# Patient Record
Sex: Male | Born: 1965
Health system: Southern US, Community
[De-identification: ages and names within clinical notes are randomized; demographics above are authoritative.]

---

## 2010-03-05 IMAGING — CR DG LUMBAR SPINE COMPLETE 4+V
5 series · 5 of 5 positions shown · non-contrast
Comparison: None

CLINICAL DATA: Low back pain

LUMBAR SPINE - COMPLETE 4+ VIEW

[view not recorded (1 of 5)]
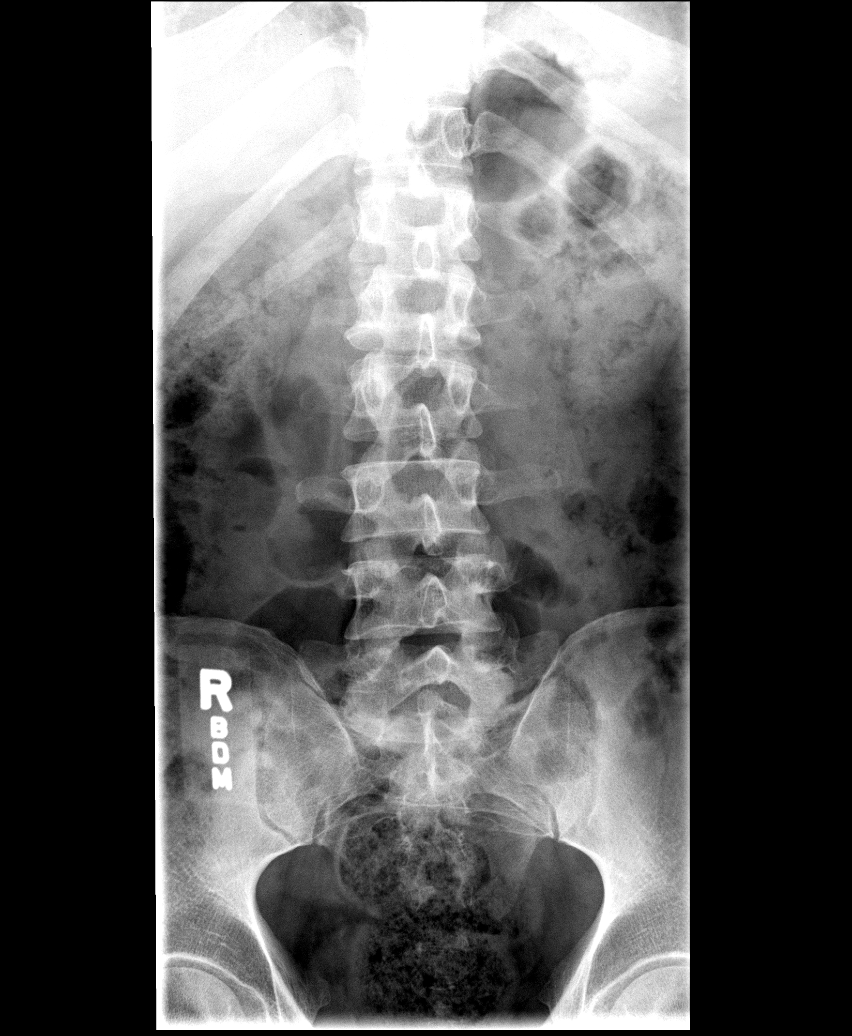

[view not recorded (2 of 5)]
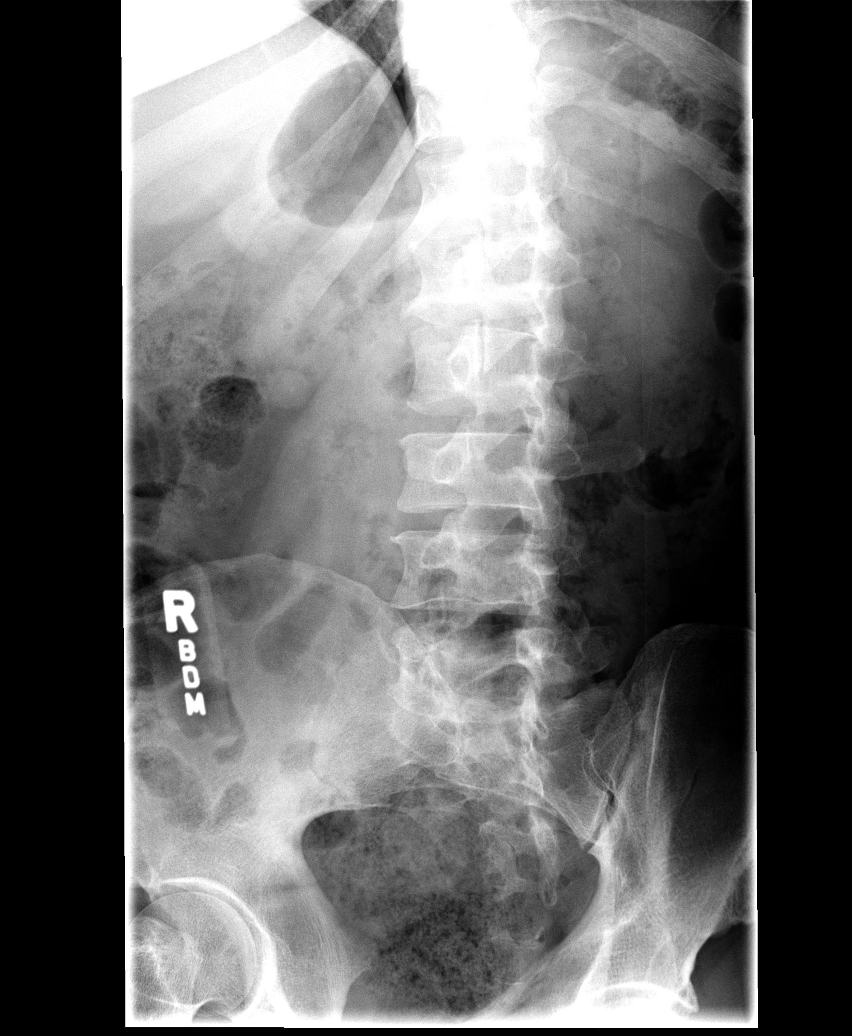

[view not recorded (3 of 5)]
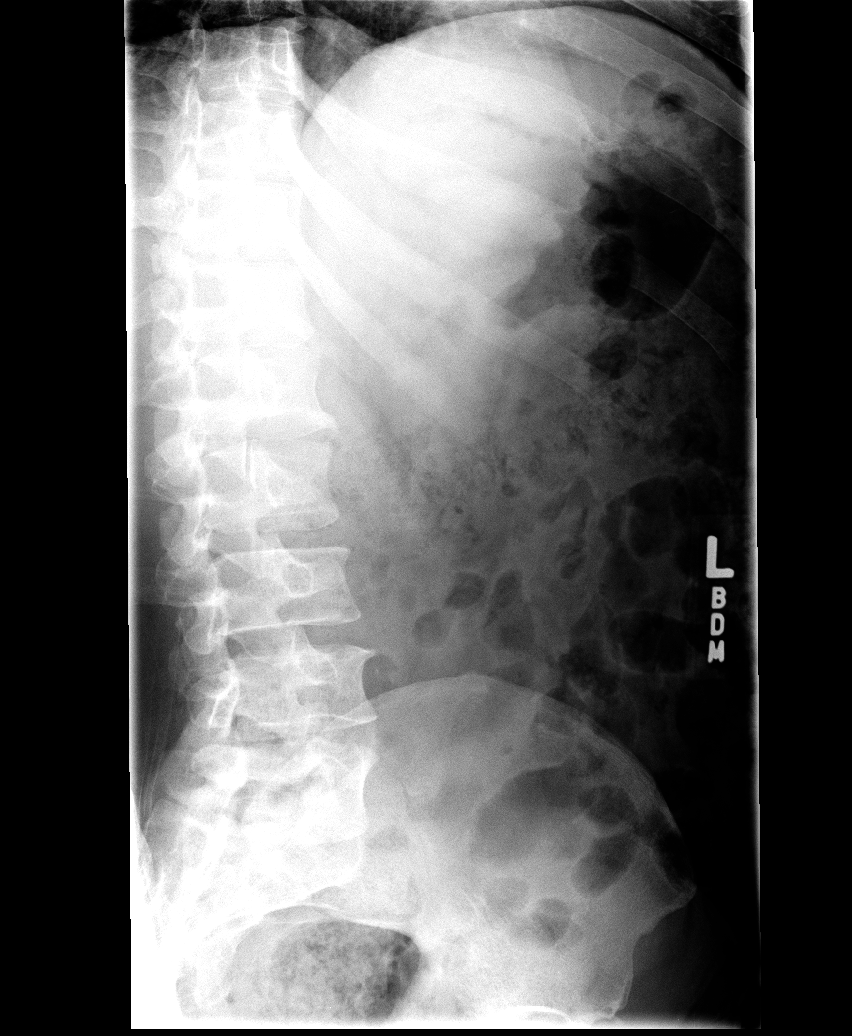

[view not recorded (4 of 5)]
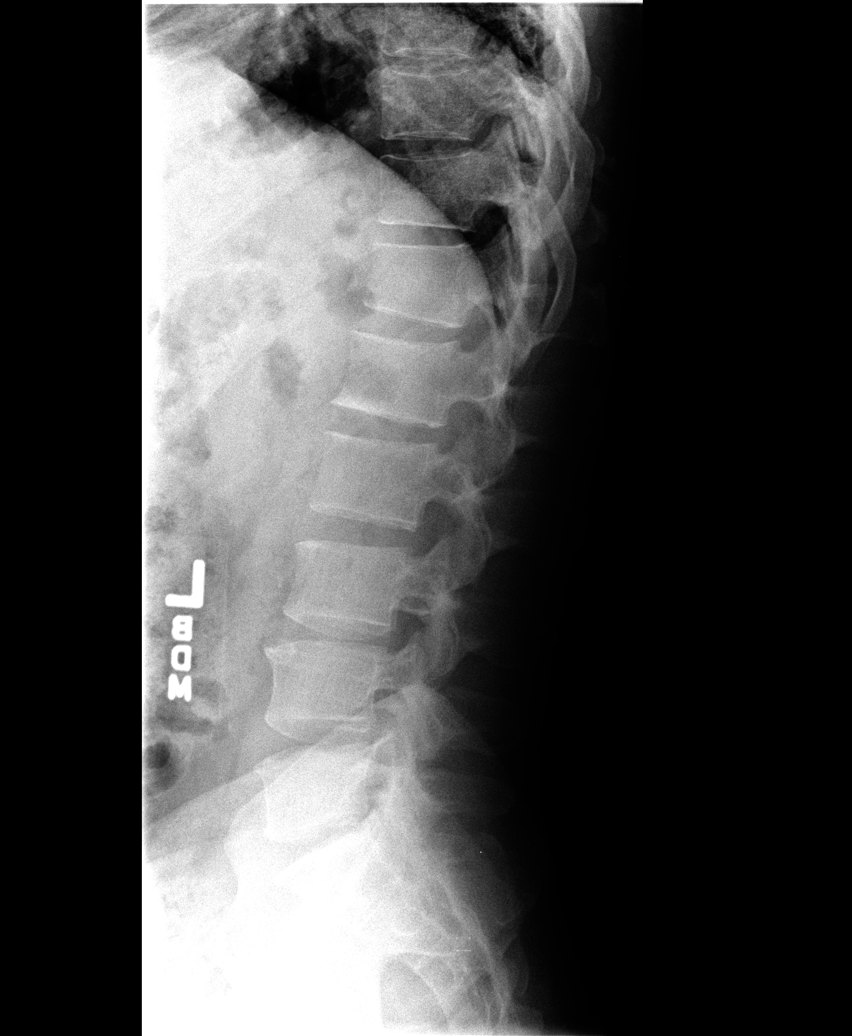

[view not recorded (5 of 5)]
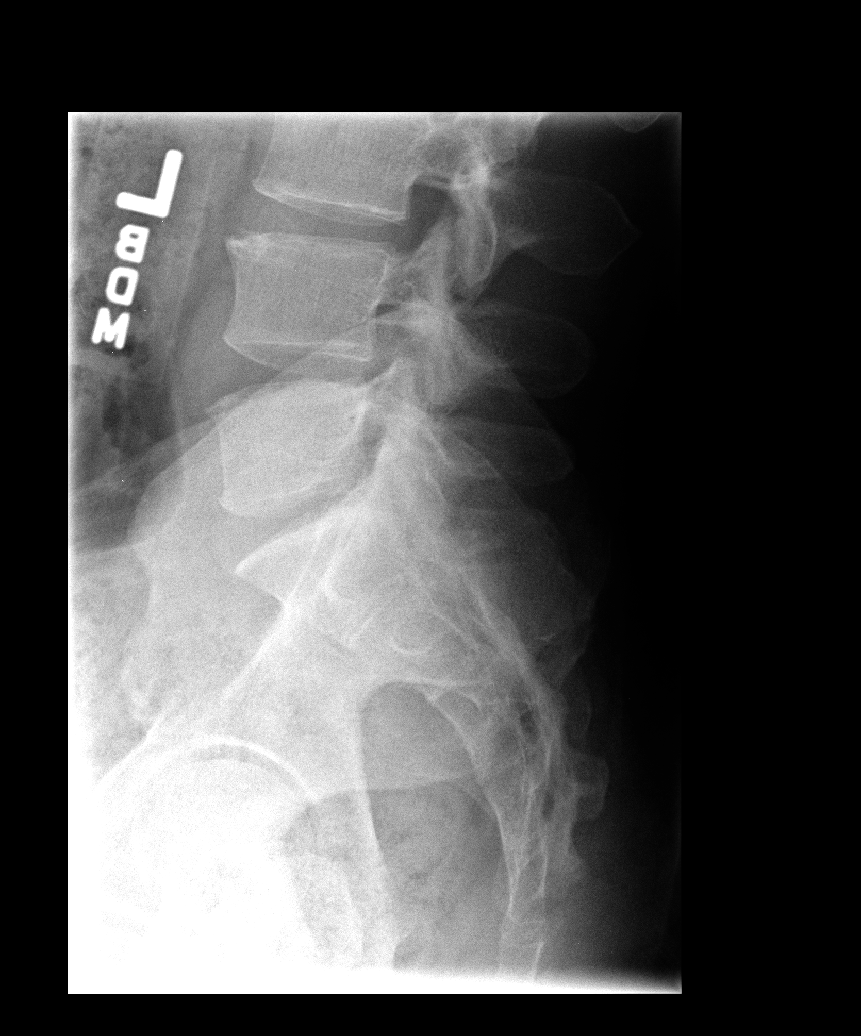

[5 of 5 positions shown; findings below may reference images not displayed]

FINDINGS: Negative for fracture.  Normal alignment.  Mild lumbar
disc degeneration without significant spondylosis.  Negative for
pars defect.
IMPRESSION: No acute bony abnormality.  Mild degenerative change.

## 2010-04-15 IMAGING — CR DG FOREARM 2V*L*
2 series · 2 of 2 positions shown · non-contrast
Comparison: None.

CLINICAL DATA: Fall.  Left forearm pain.

LEFT FOREARM - 2 VIEW

[view not recorded (1 of 2)]
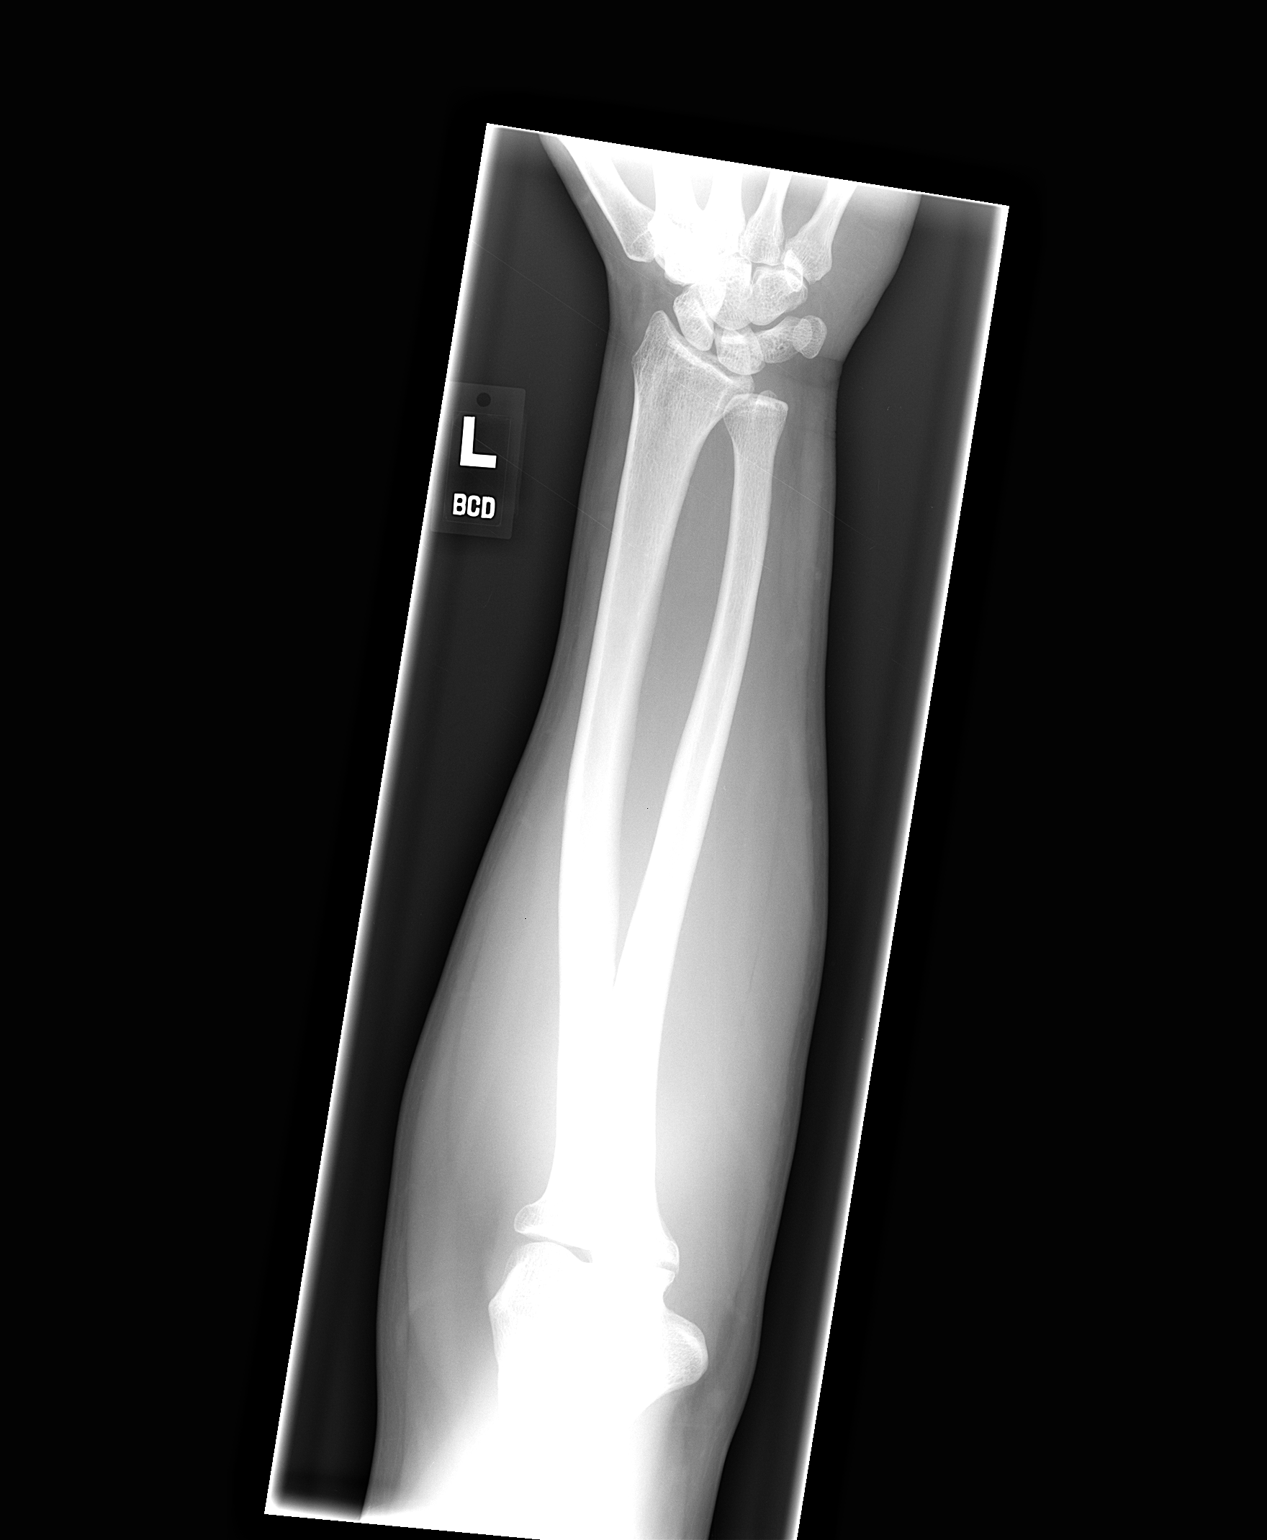

[view not recorded (2 of 2)]
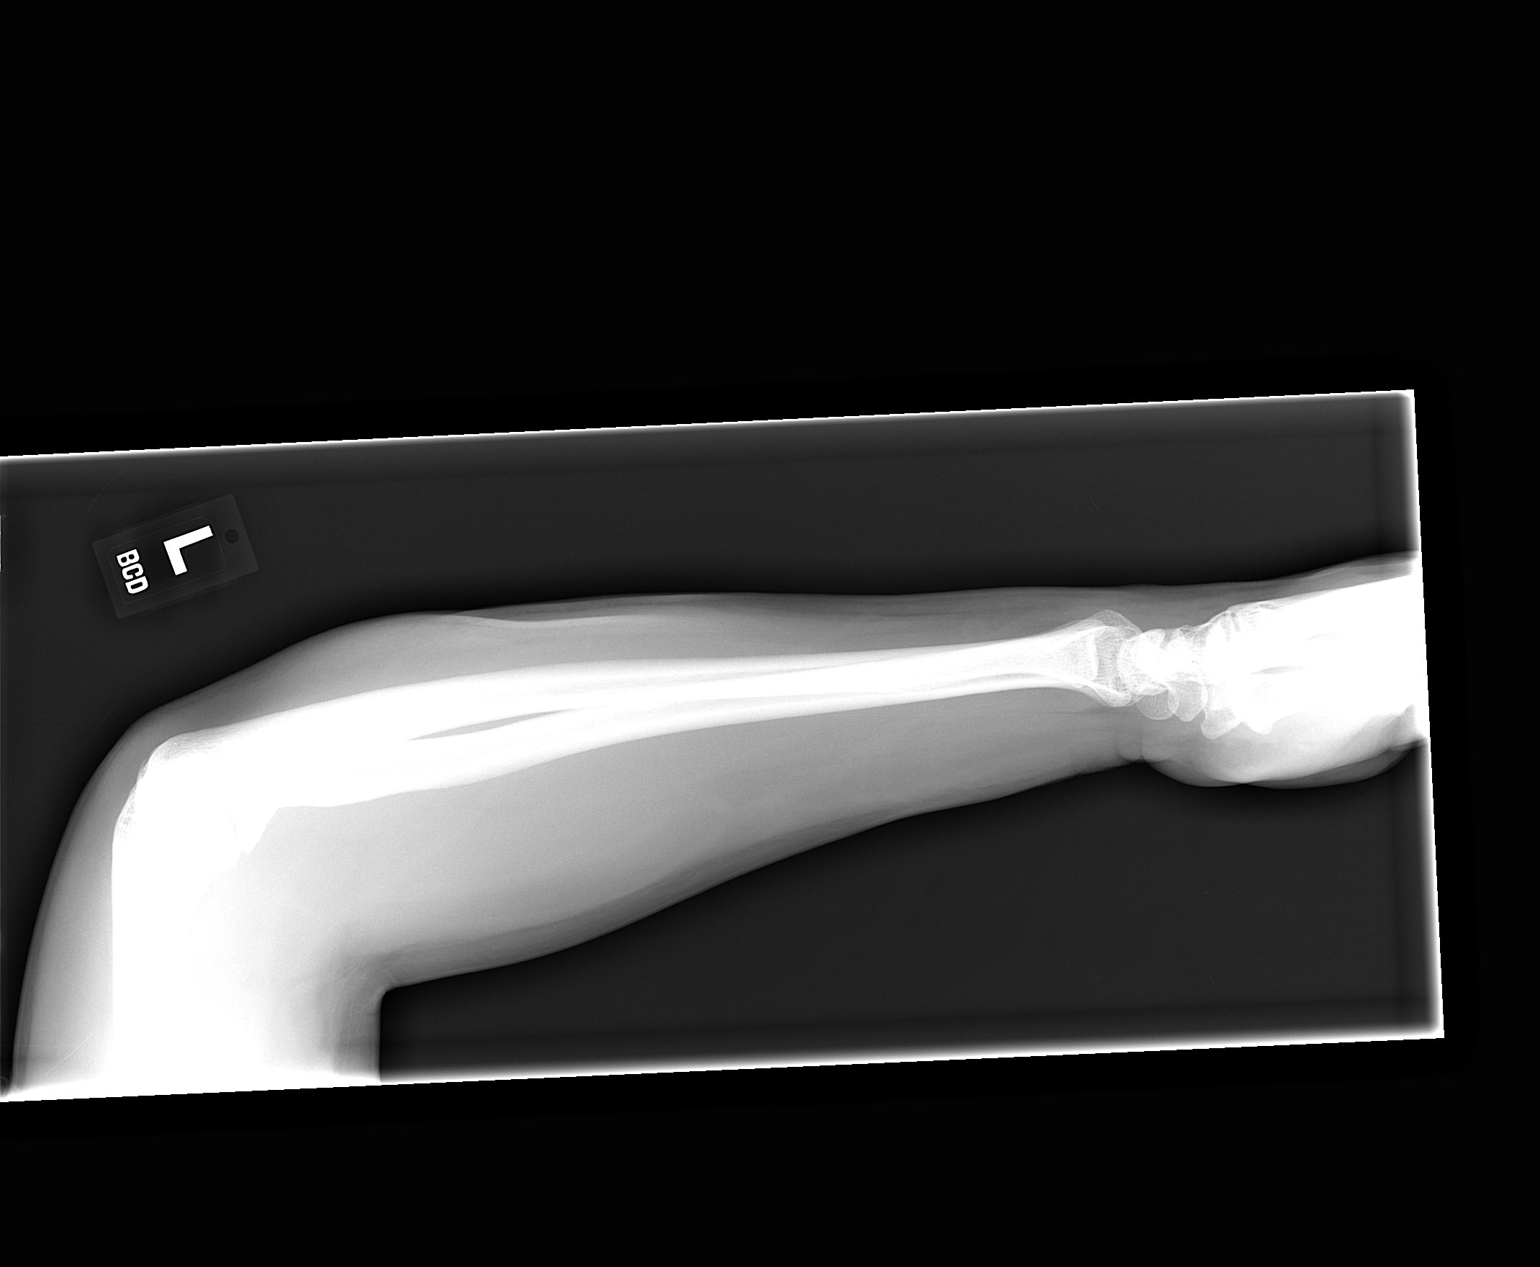

[2 of 2 positions shown; findings below may reference images not displayed]

FINDINGS: No cortical discontinuity is identified.  Slight
curvature of the radius is probably incidental, less likely to
represent a plastic bowing fracture.  If there is a high clinical
index of suspicion, contralateral forearm views could be obtained
to compare the degree of curvature in the radius, if this would
affect therapy.
IMPRESSION: 1.  No cortical discontinuity identified to suggest overt fracture.
Slight curvature of the radius is probably within normal limits but
the contralateral radius views could be obtained to help rule out
plastic bowing fracture if clinically warranted.

## 2013-03-04 IMAGING — CR DG LUMBAR SPINE COMPLETE 4+V
5 series · 5 of 5 positions shown · non-contrast
Comparison: [DATE]

CLINICAL DATA: Back pain for 2 months.  No known injury.

EXAM:
LUMBAR SPINE - COMPLETE 4+ VIEW

[t l-spine a.p.]
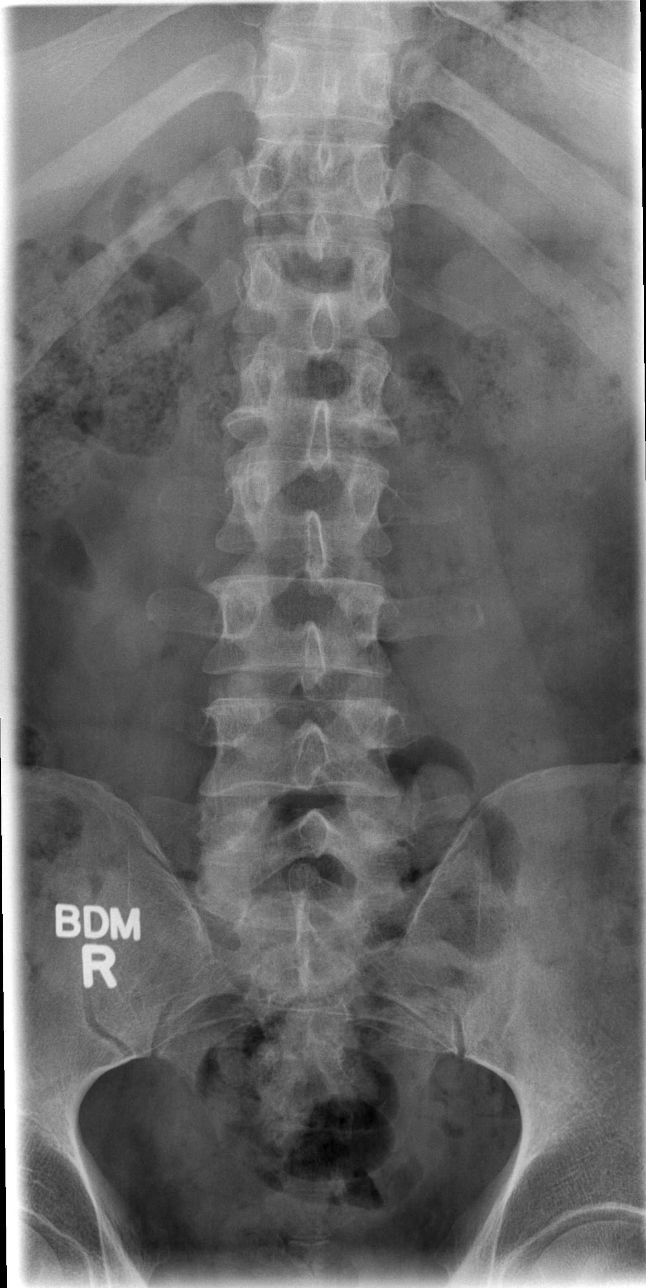

[t l-spine oblique exposure (1 of 2)]
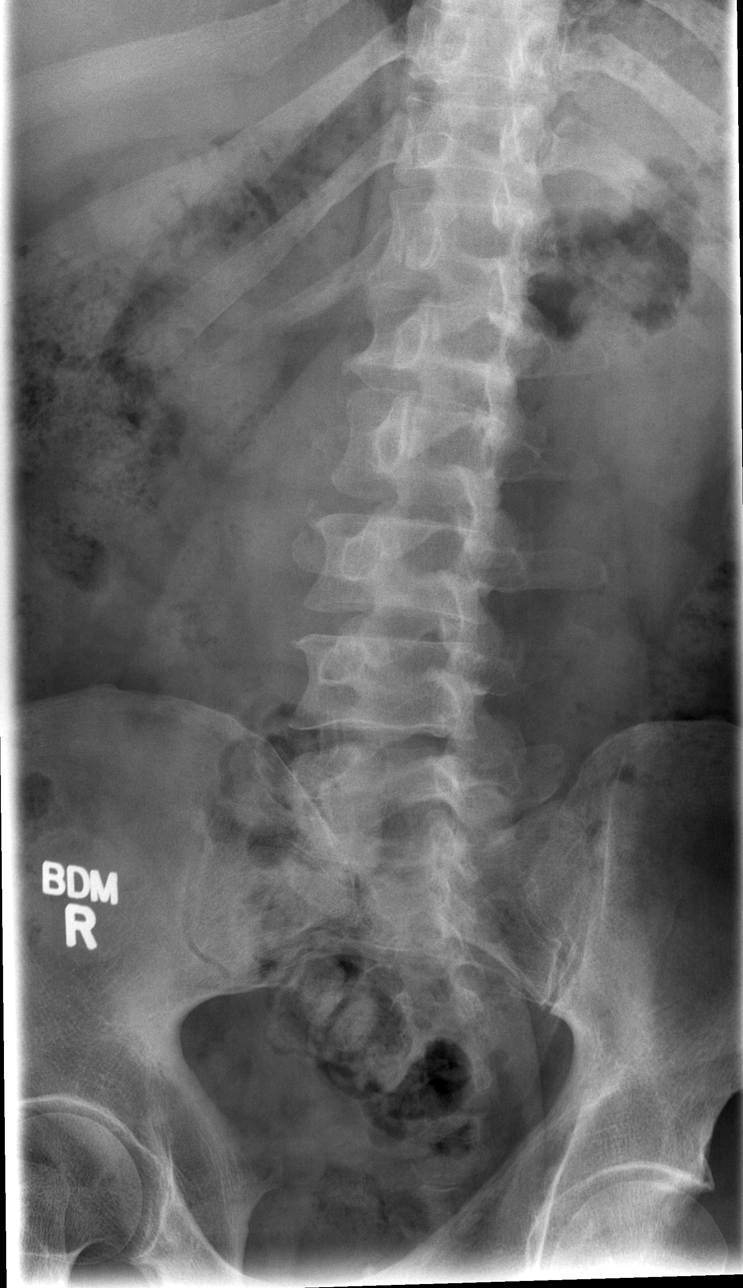

[t l-spine oblique exposure (2 of 2)]
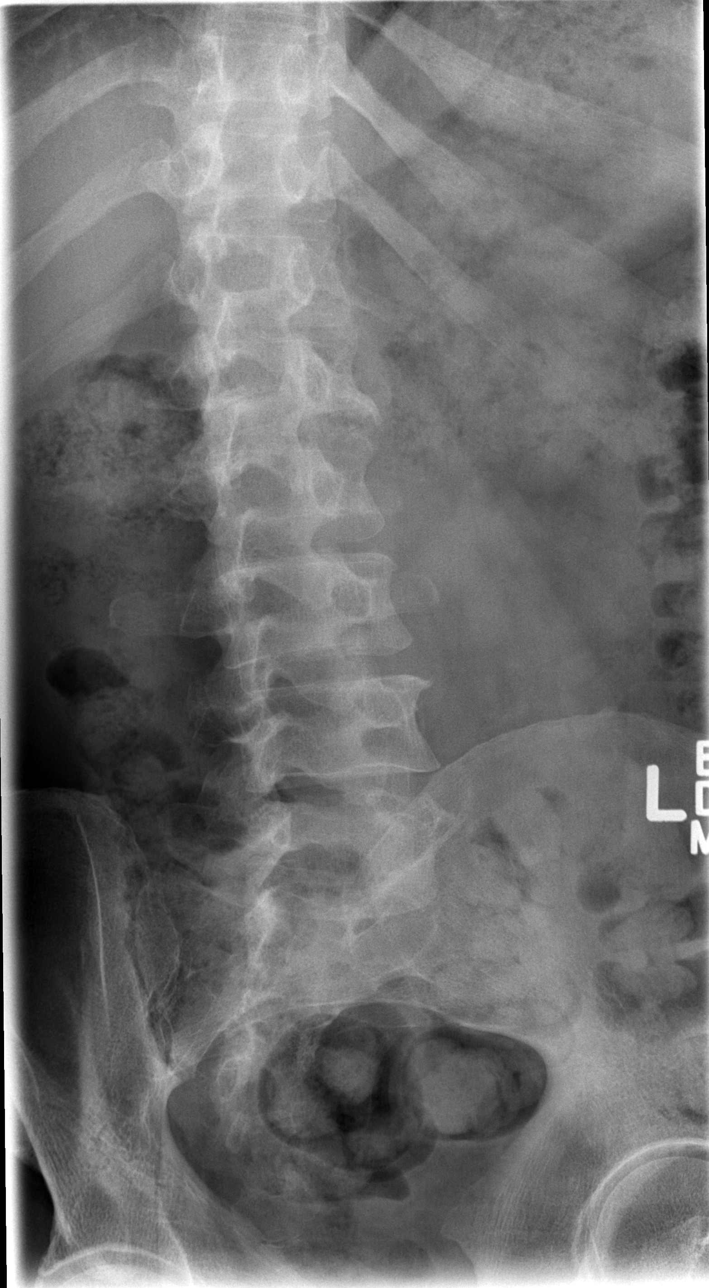

[t l-spine lat]
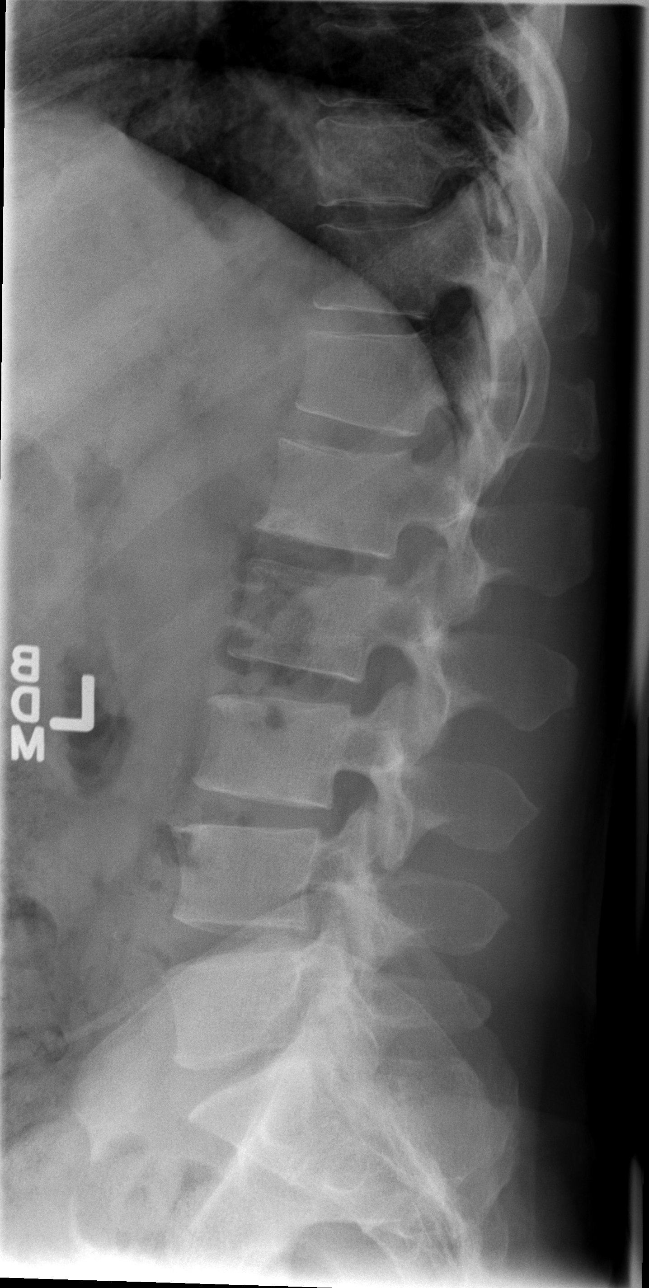

[t l-spine l5-s1 spot]
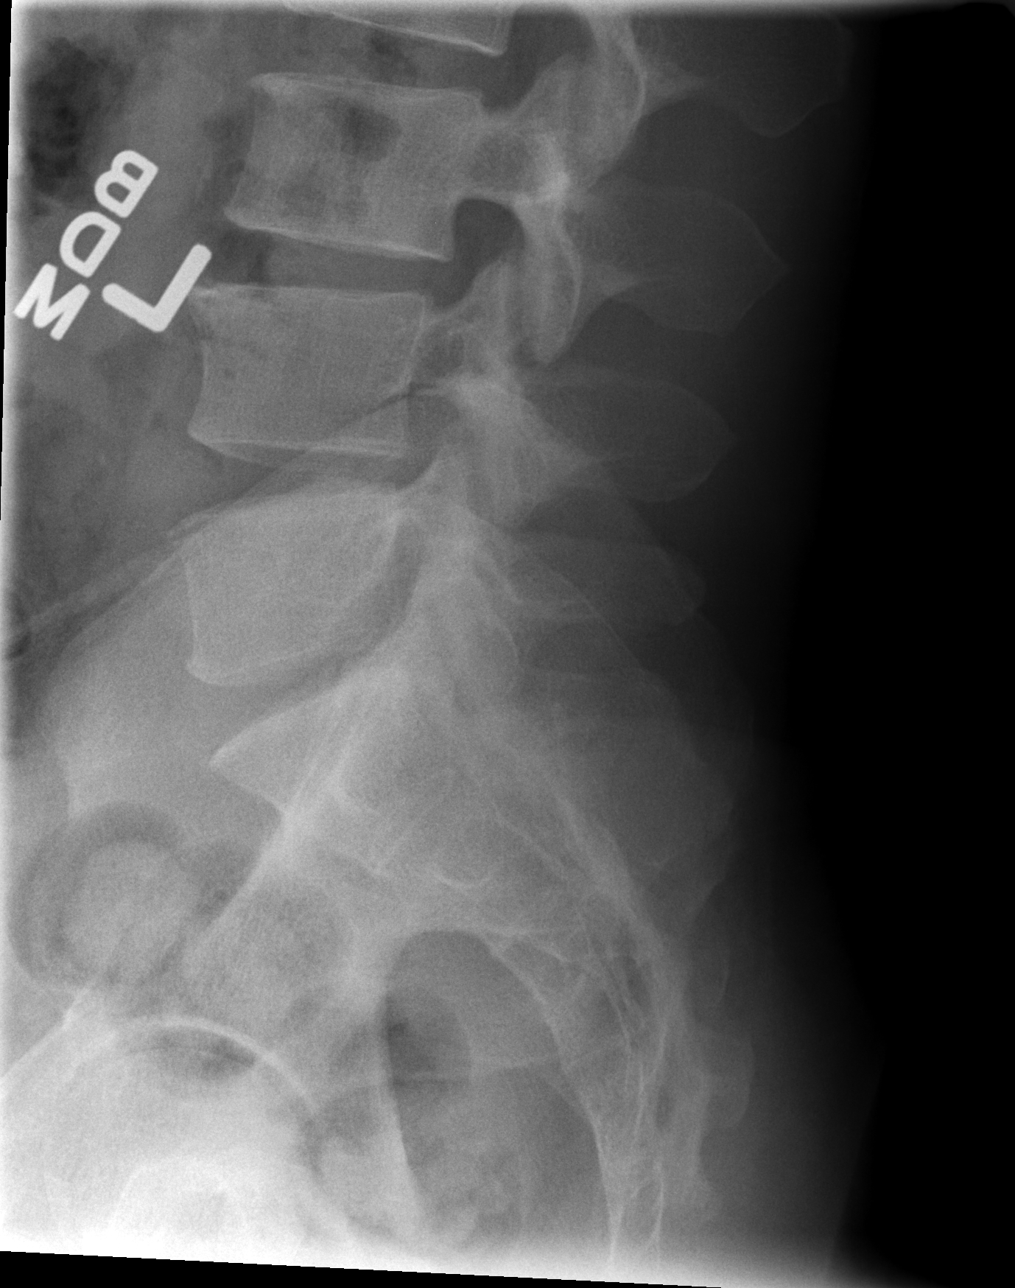

[5 of 5 positions shown; findings below may reference images not displayed]

FINDINGS: No fracture. No spondylolisthesis. Minimal curvature of the lumbar
spine, convex to the right. Minimal endplate osteophytes are noted
at several levels. No other degenerative change. Normal soft
tissues.
IMPRESSION: No fracture.  No acute finding. No change from the prior study.

## 2016-09-20 IMAGING — DX DG FOOT COMPLETE 3+V*L*
3 series · 3 of 3 positions shown · non-contrast
Comparison: None.

CLINICAL DATA: 50-year-old male with left foot injury and pain.

EXAM:
LEFT FOOT - COMPLETE 3+ VIEW

[foot ap]
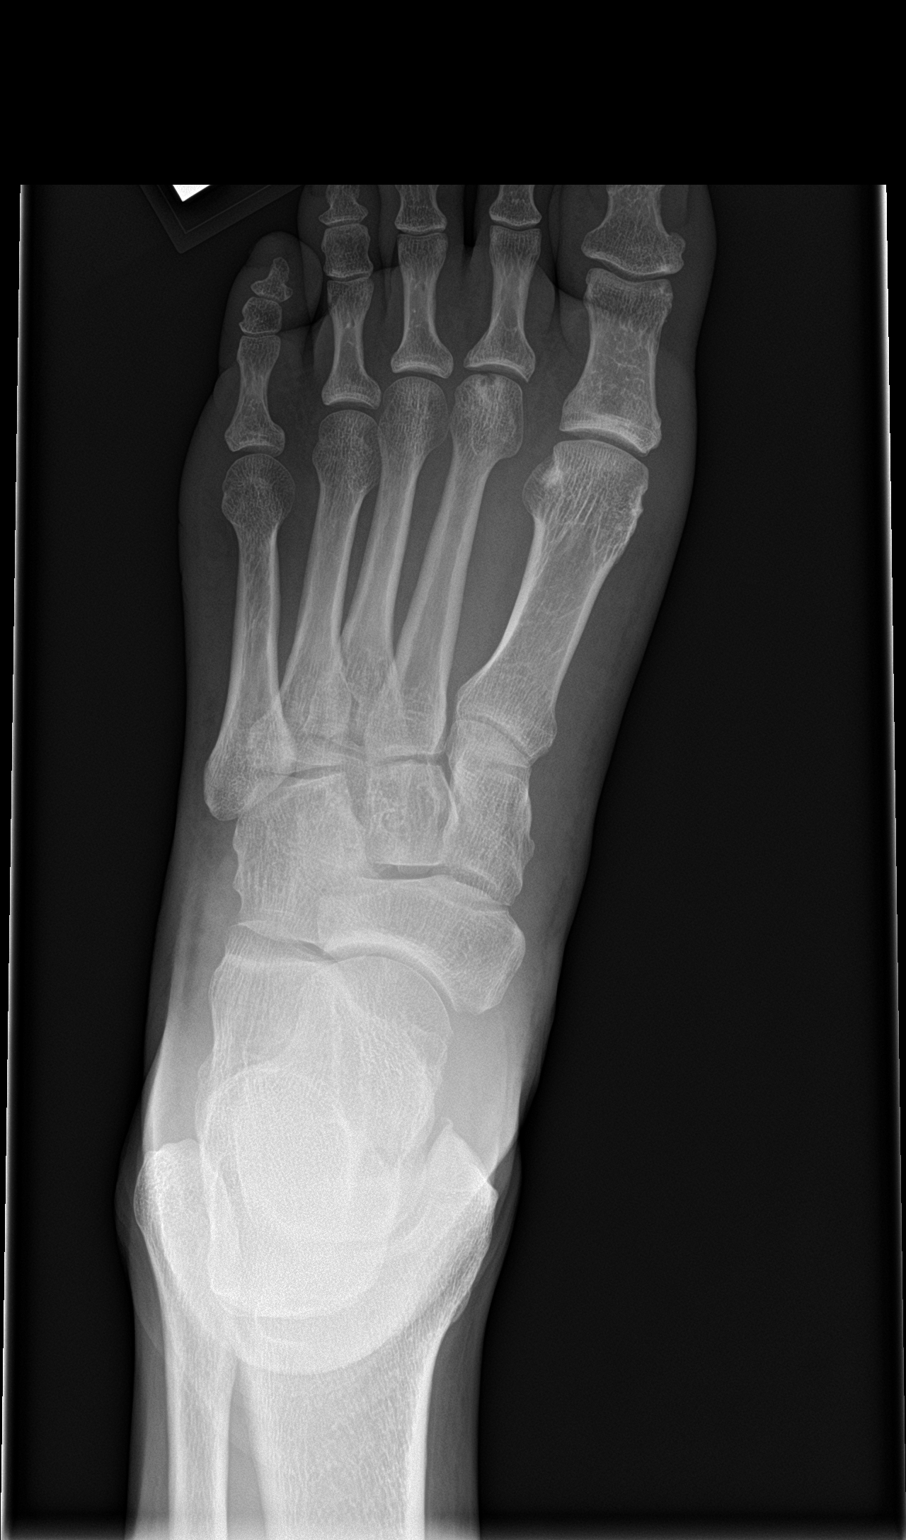

[foot obl]
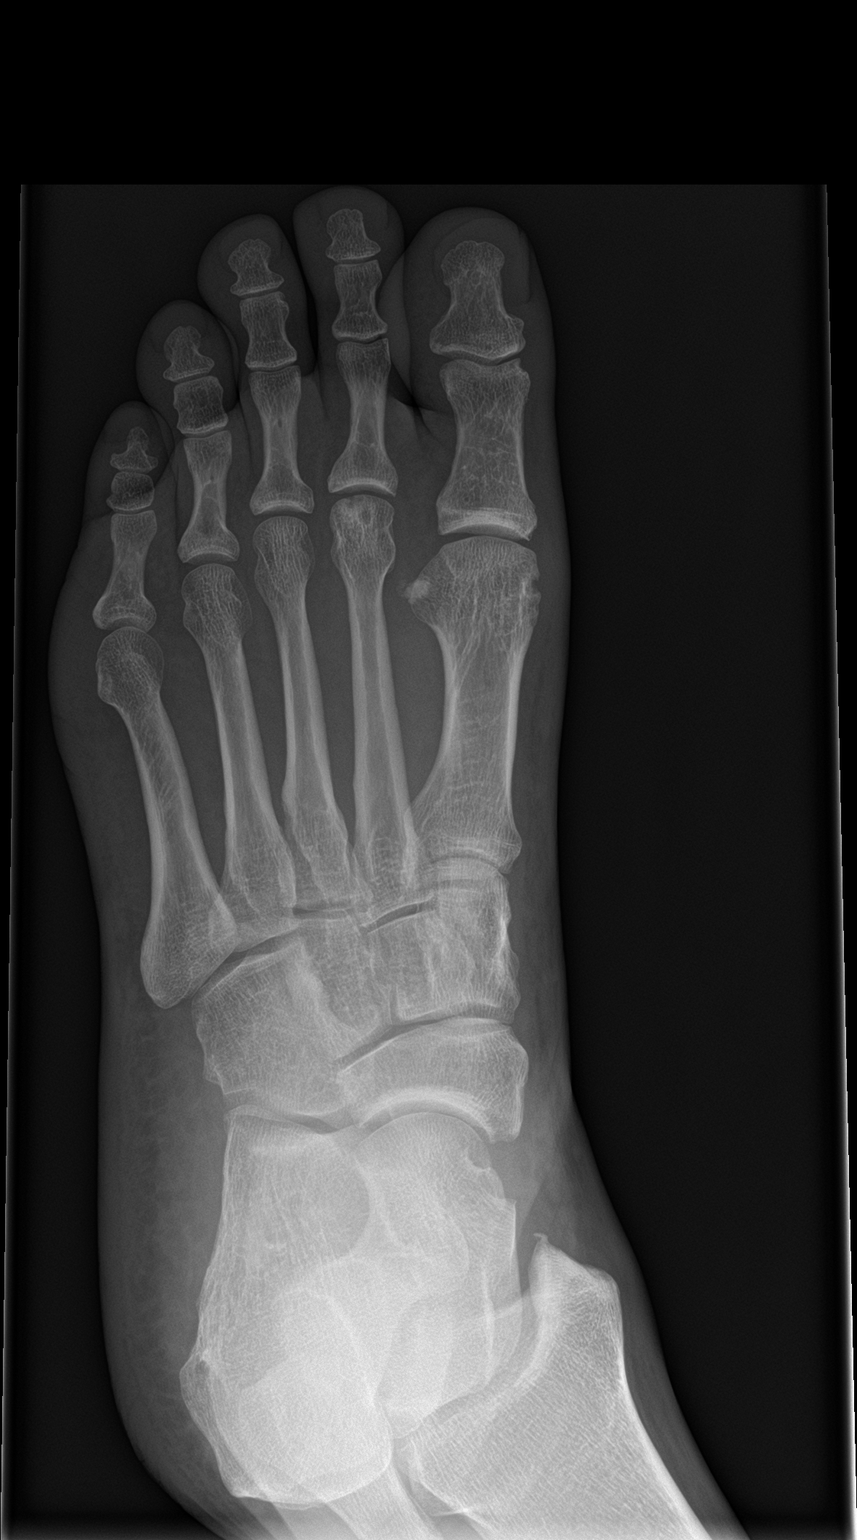

[foot lat]
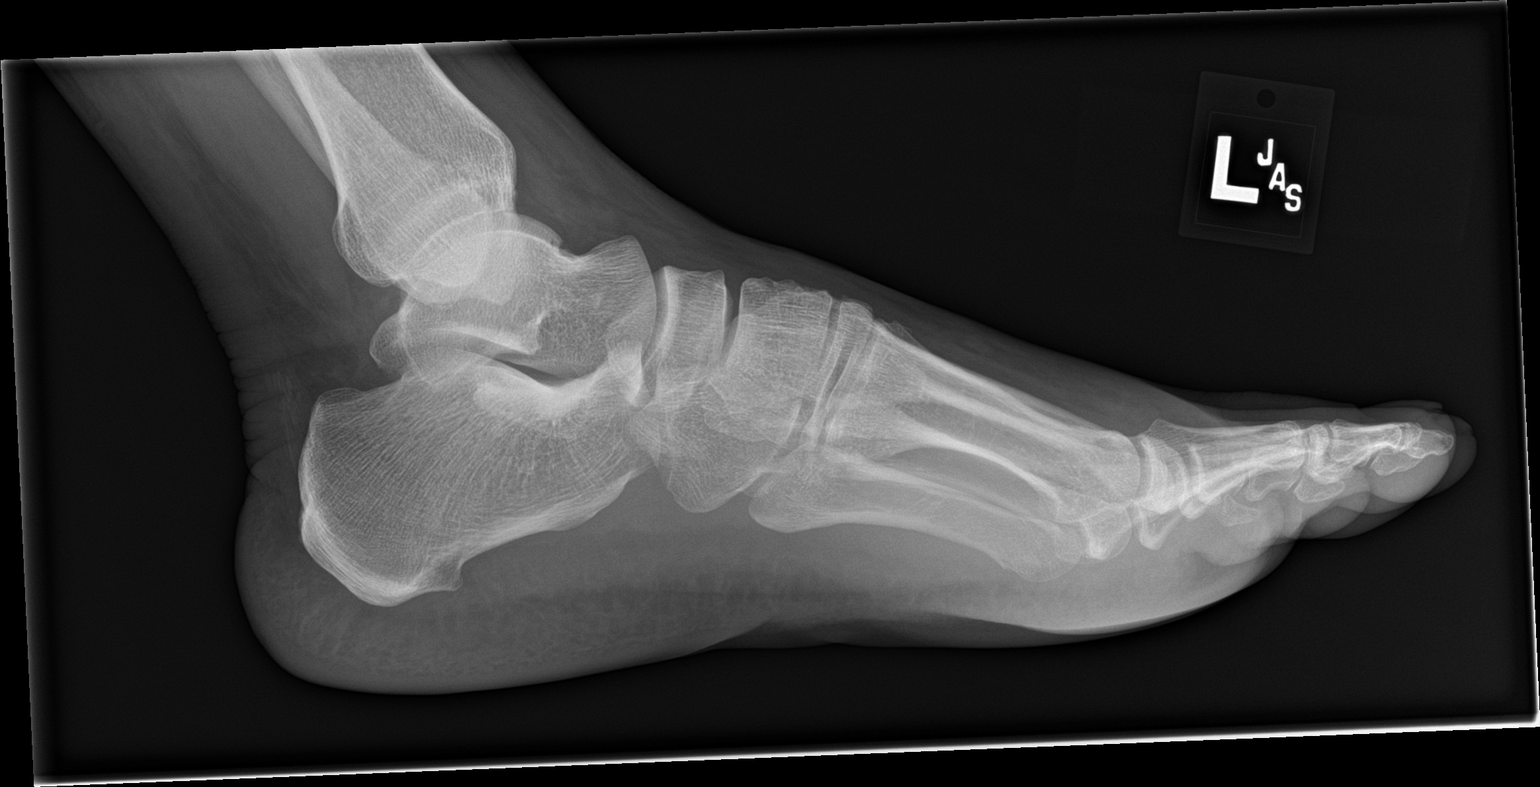

[3 of 3 positions shown; findings below may reference images not displayed]

FINDINGS: There is no acute fracture or dislocation. There are mild erosive
changes of the medial aspect of the first metatarsal head with
overhanging edge as well as erosive changes of the second metatarsal
head. Correlation with history of gout recommended. The bones are
well mineralized. The soft tissues appear unremarkable.
IMPRESSION: 1. No acute fracture or dislocation.
2. Erosive changes of the first and second metatarsal heads
suggestive of gouty arthritis. Correlation with clinical exam and
history recommended.

## 2017-03-21 IMAGING — CR DG CHEST 2V
1 series · 2 of 2 positions shown · non-contrast
Comparison: None.

CLINICAL DATA: Left-sided chest pain for 8 days.

EXAM:
CHEST  2 VIEW

[Series 1: chest pa · 0.14mm/px · 2 of 2 slices shown]
[im 1/2]
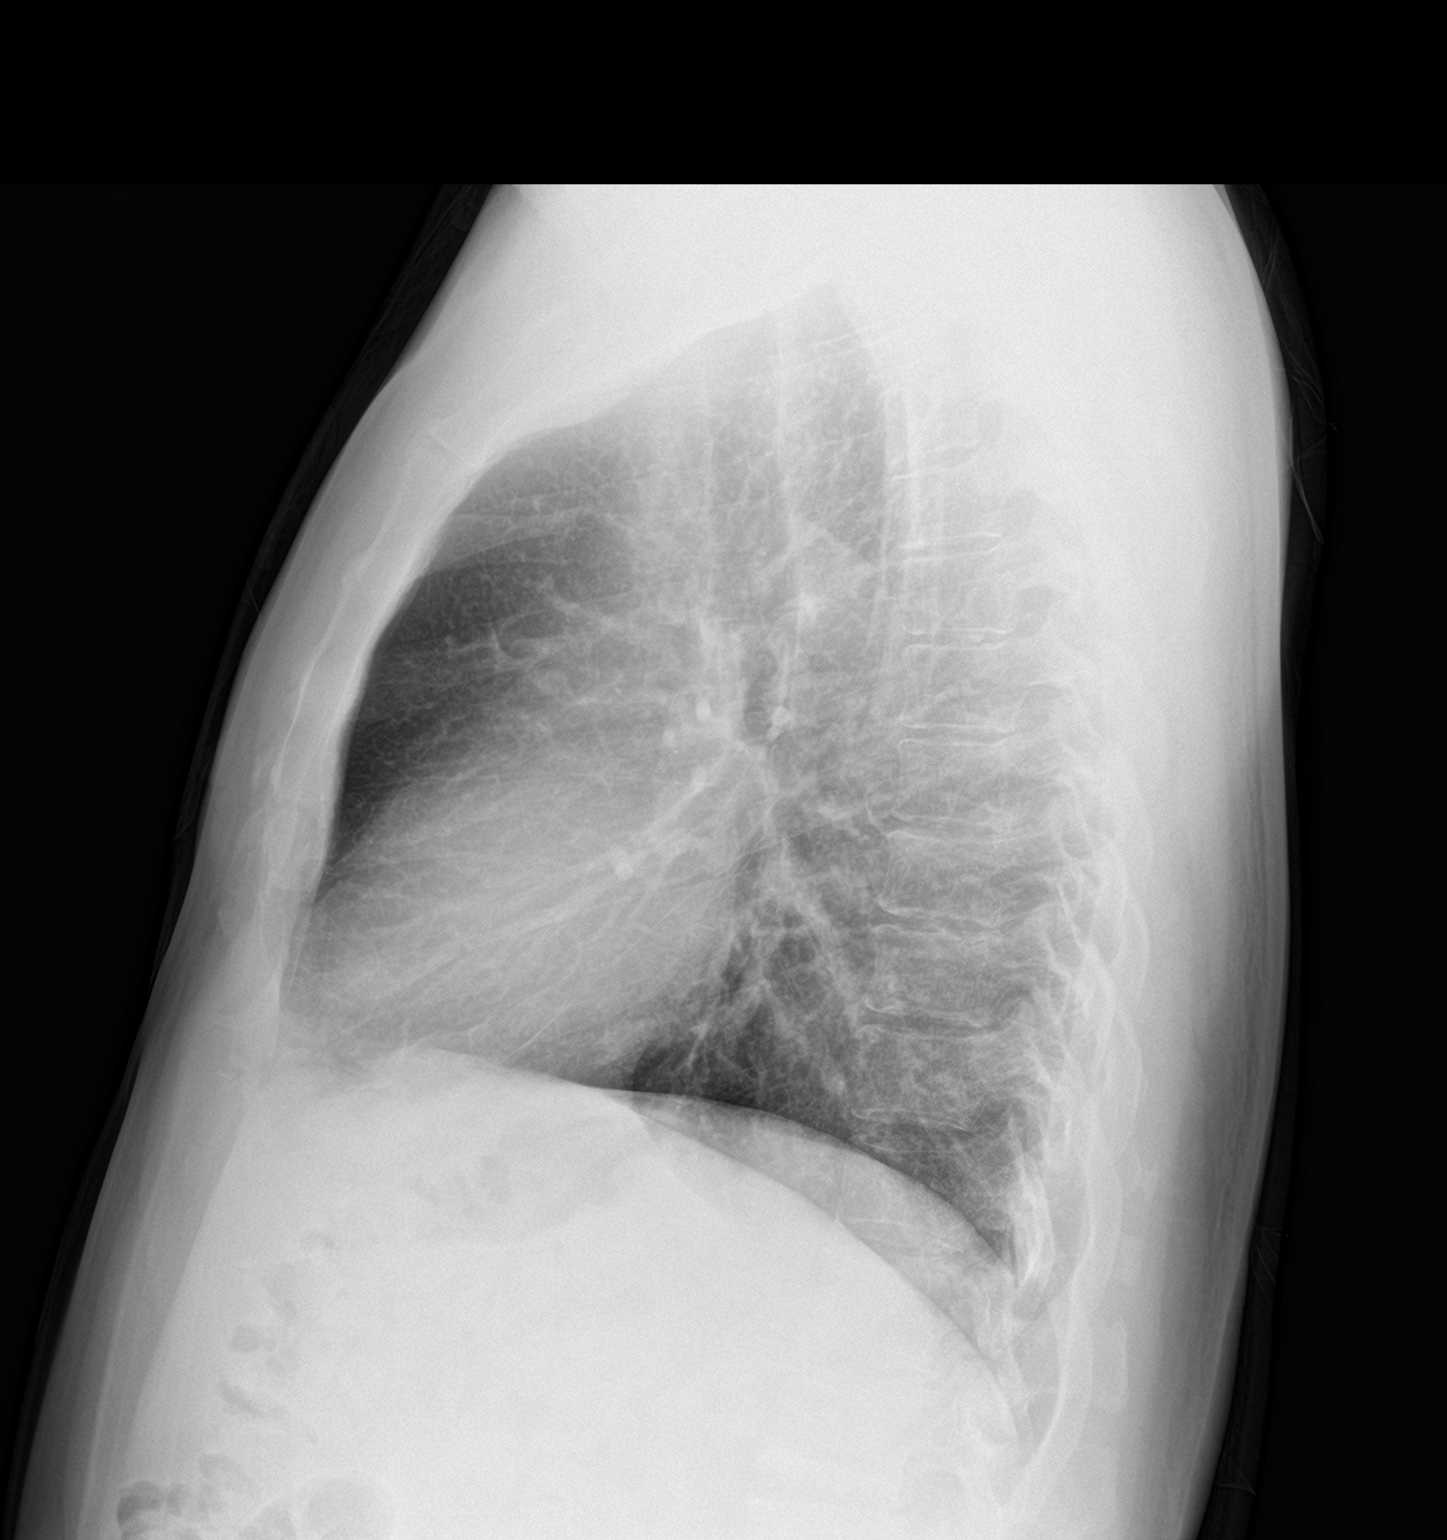
[im 2/2]
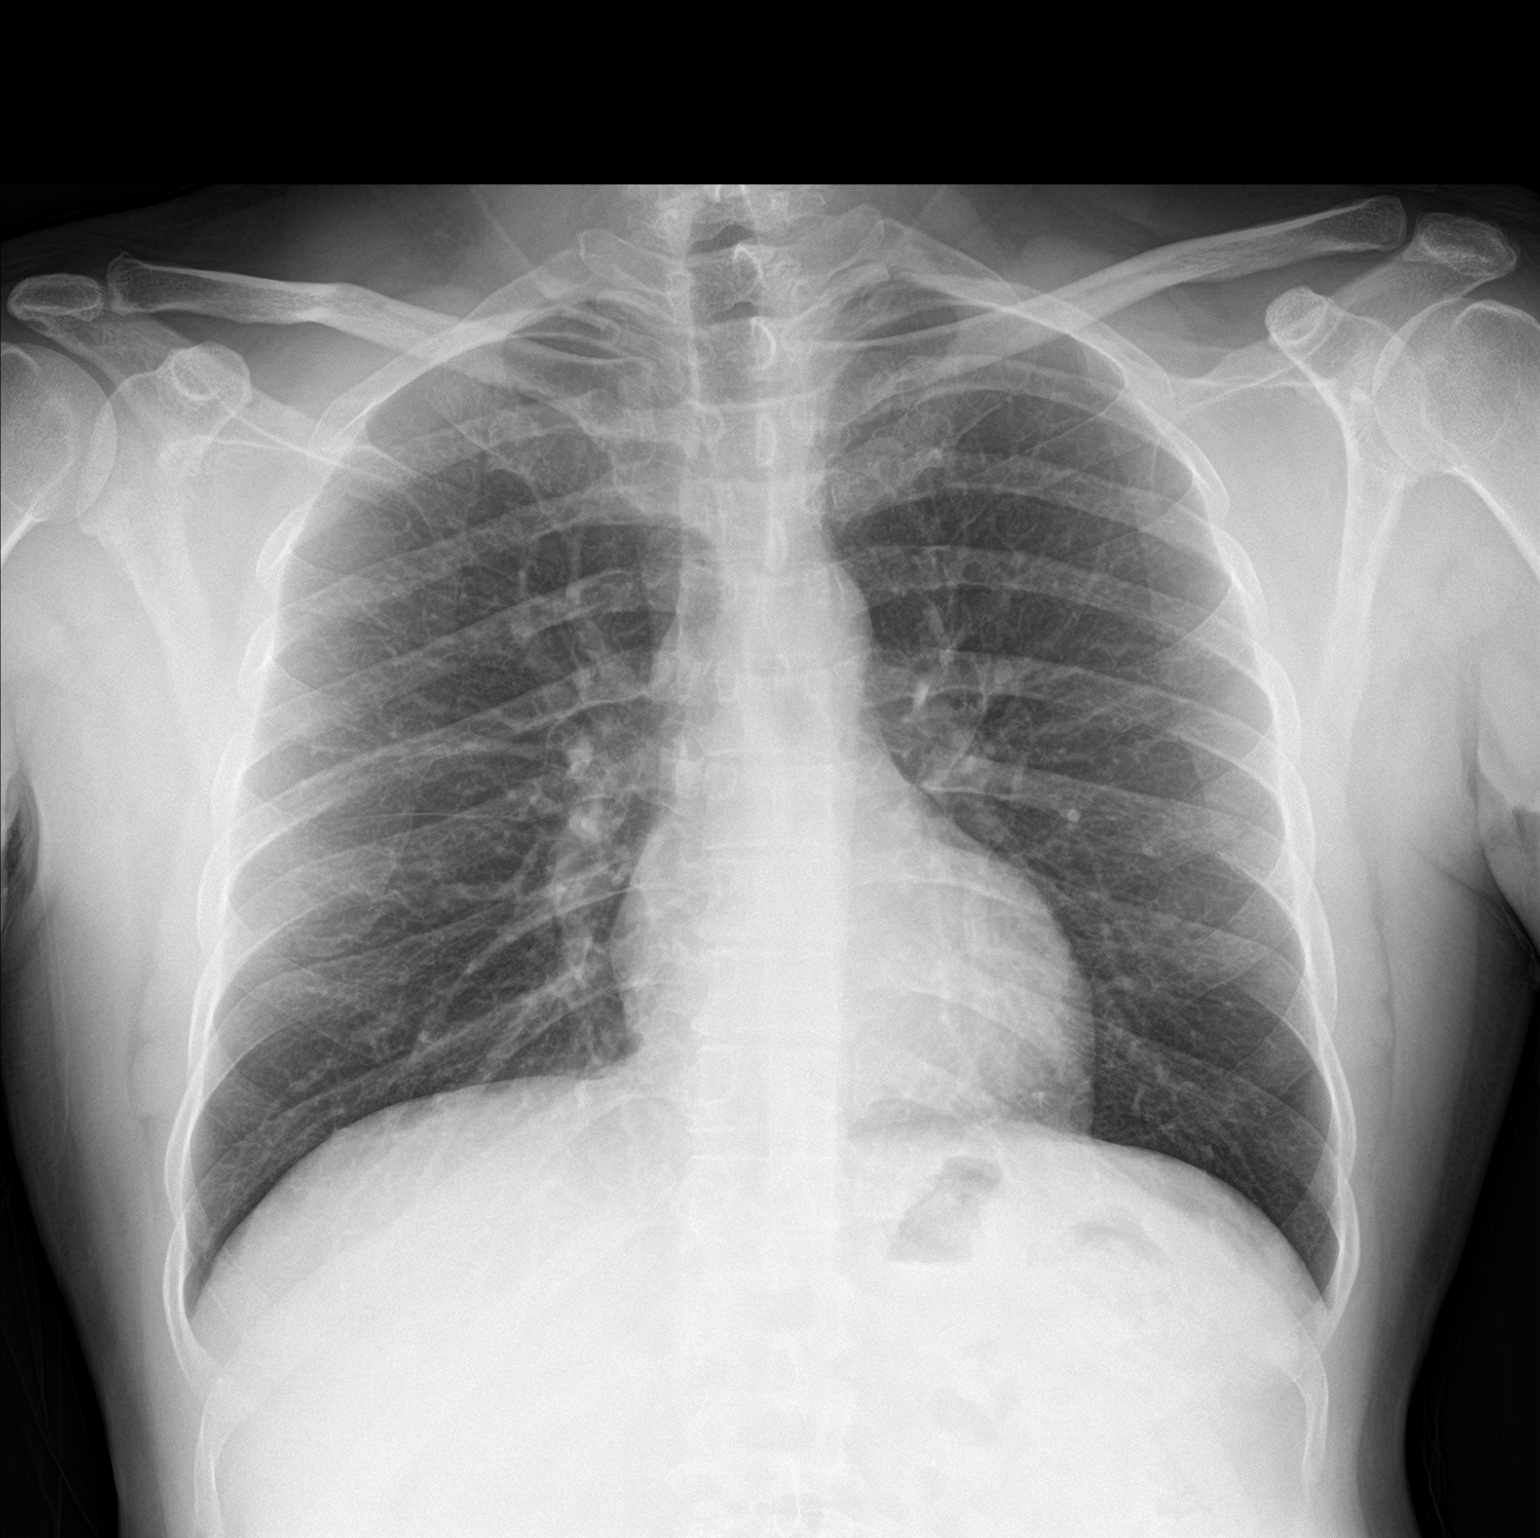

[2 of 2 positions shown; findings below may reference images not displayed]

FINDINGS: The heart size and mediastinal contours are within normal limits.
Both lungs are clear. The visualized skeletal structures are
unremarkable.
IMPRESSION: No active cardiopulmonary disease.

## 2019-05-20 MED FILL — metFORMIN HCL 500 MG TABS: 500 | 30 days supply | Qty: 60 | Fill #0

## 2019-05-20 MED FILL — LOSARTAN-HCTZ 100-12.5 MG T: 100-12.5 | 30 days supply | Qty: 30 | Fill #0

## 2020-01-27 IMAGING — CT CT HEAD W/O CM
4 series · 16 of 47 positions shown, 18 images · non-contrast
Comparison: None.

CLINICAL DATA: Hyperglycemia, intermittent medication use, nausea,
vomiting and dizziness

EXAM:
CT HEAD WITHOUT CONTRAST
TECHNIQUE: Contiguous axial images were obtained from the base of the skull
through the vertex without intravenous contrast.

[Series 3: head wo · axial · 0.39mm/px · z∈[-175,-60]mm · 6 of 33 slices shown, 8 images]
[im 5/33  brain]
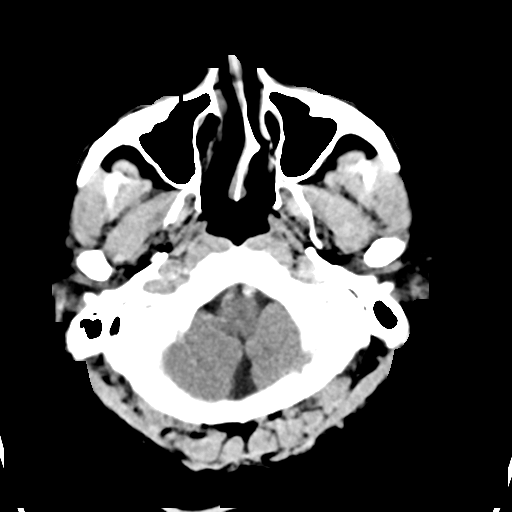
[im 5/33  bone]
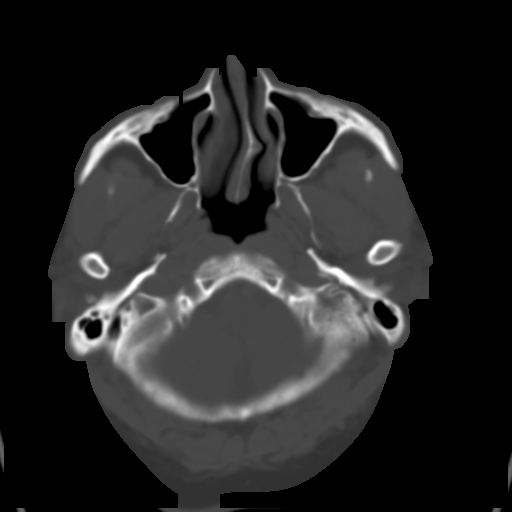
[im 10/33  brain]
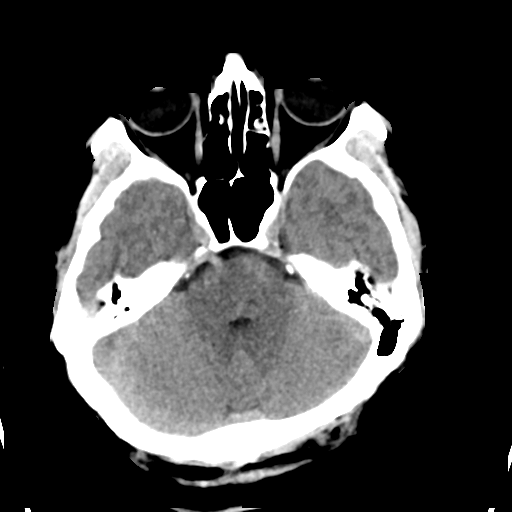
[im 14/33  brain]
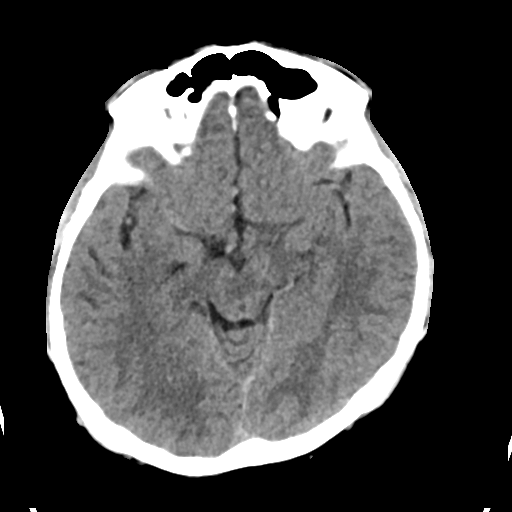
[im 19/33  brain]
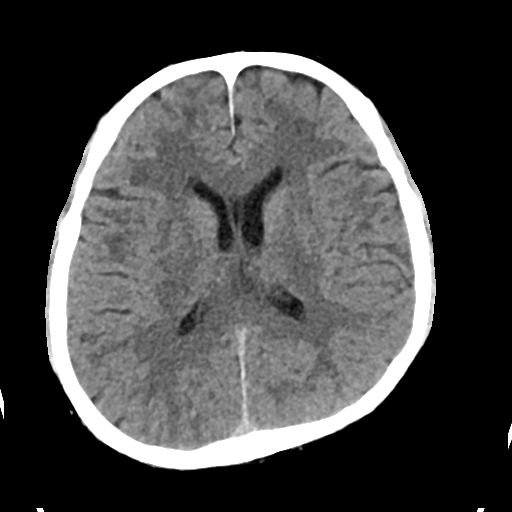
[im 23/33  brain]
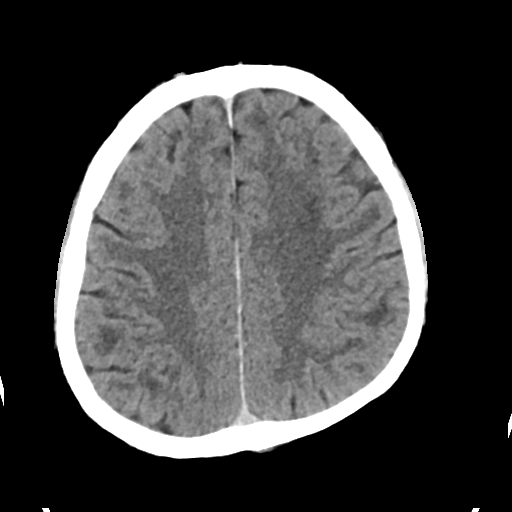
[im 23/33  bone]
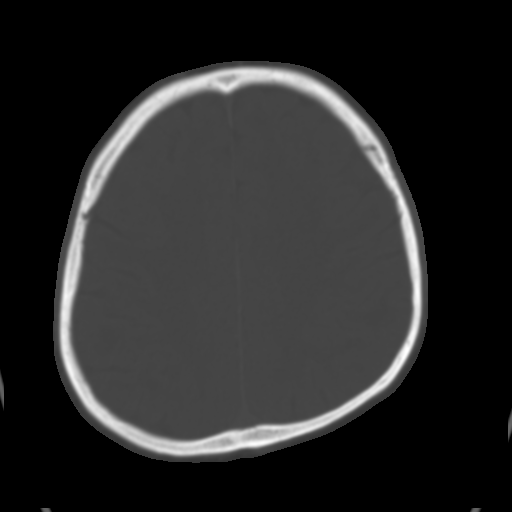
[im 28/33  brain]
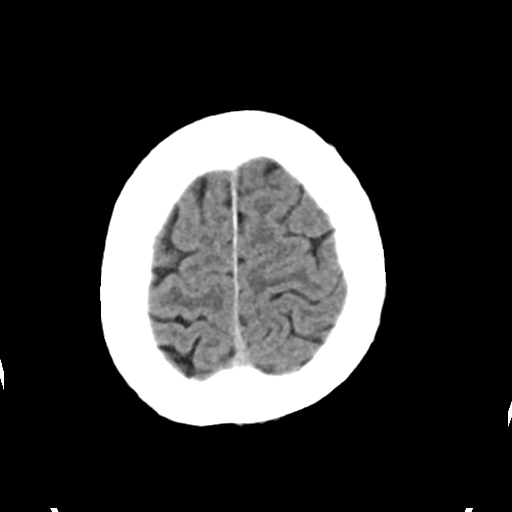

[Series 4: head bone · axial · 0.39mm/px · z∈[-181,-125]mm · 4 of 84 slices shown]
[im 8/84  bone]
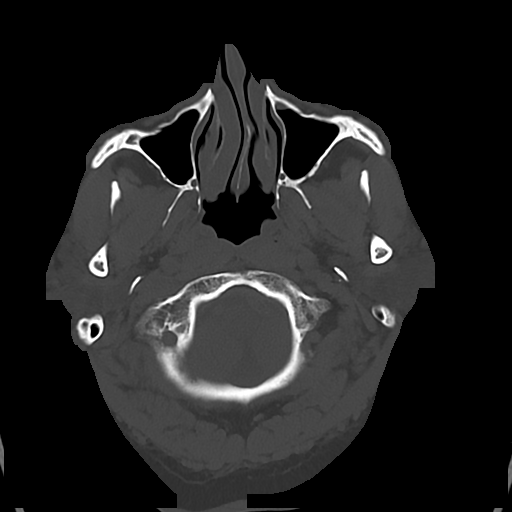
[im 16/84  bone]
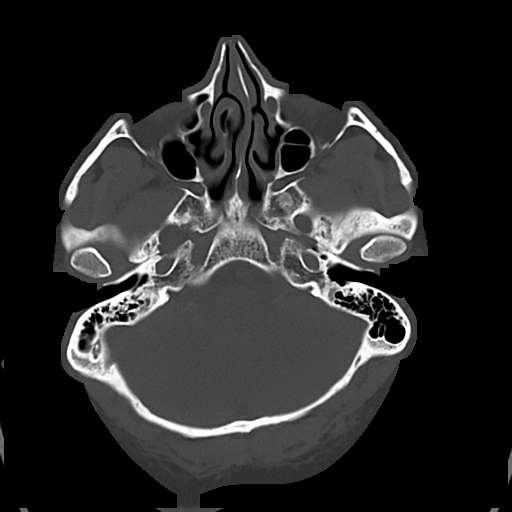
[im 28/84  bone]
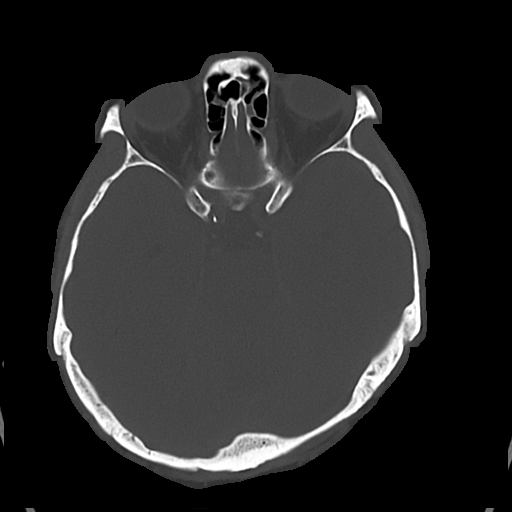
[im 36/84  bone]
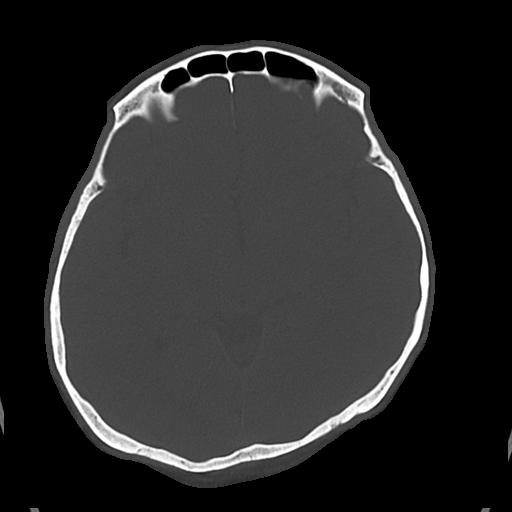

[Series 5: cor soft · coronal · 0.37mm/px · 3 of 68 slices shown]
[im 23/68  brain]
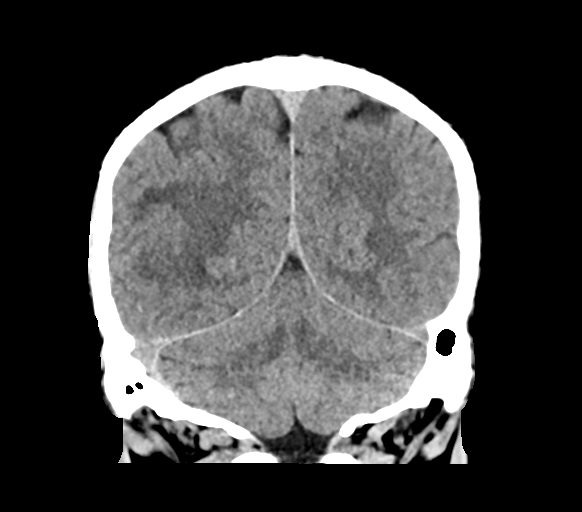
[im 30/68  brain]
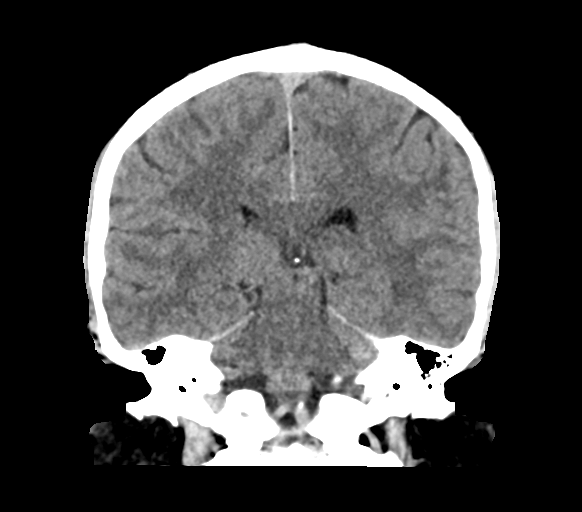
[im 38/68  brain]
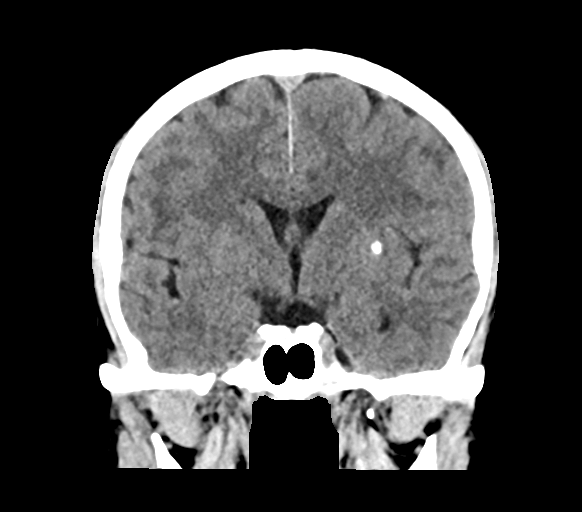

[Series 6: sag soft · sagittal · 0.43mm/px · 3 of 62 slices shown]
[im 21/62  brain]
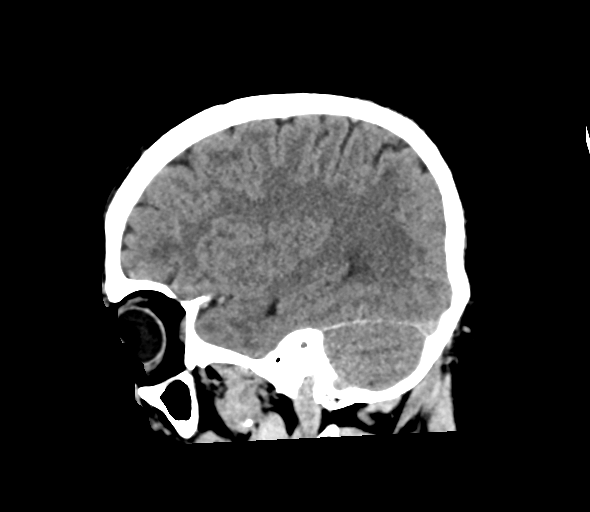
[im 31/62  brain]
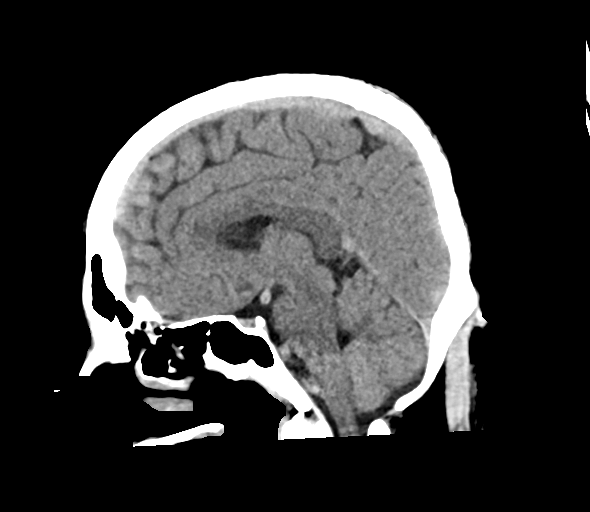
[im 41/62  brain]
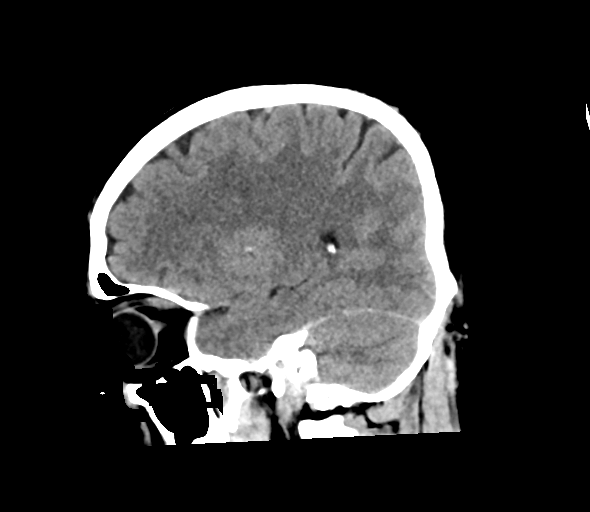

[16 of 47 positions shown; findings below may reference images not displayed]

FINDINGS: Brain: 7 mm calcification is seen with left lentiform nucleus,
nonspecific in appearance, possibly the result prior vascular
injury, insult versus prior infection. Additional quite focal
subcortical areas of white matter hypoattenuation throughout the
supratentorial brain are also quite nonspecific particularly in the
given patient history. No acute intracranial hemorrhage. No large
vascular territory infarct or cortically based hypoattenuation is
seen.

Vascular: Atherosclerotic calcification of the carotid siphons. No
hyperdense vessel.

Skull: Asymmetry of the skull, likely reflecting a unilateral
plagiocephaly, developmental variant. No scalp swelling or hematoma.
No calvarial fracture or acute osseous abnormality.

Sinuses/Orbits: Paranasal sinuses and mastoid air cells are
predominantly clear. Included orbital structures are unremarkable.

Other: None.
IMPRESSION: 1. No acute intracranial abnormality.
2. Multitude of focal subcortical white matter hypoattenuation
3. 7 mm calcification with left lentiform nucleus, nonspecific in
appearance, possibly the result prior vascular insult or prior
infection.
4. Additional numerous scattered areas of quite focal subcortical
white matter hypoattenuation are nonspecific, predominantly in a
juxtacortical distribution rather than periventricular. While this
could reflect some chronic microvascular change particularly in the
setting of diabetes, demyelination or other white matter processes
are not fully excluded.
5. Asymmetry of the skull, likely reflecting a unilateral
plagiocephaly, developmental variant.

These results were called by telephone at the time of interpretation
on [DATE] at [DATE] to provider QG , who verbally
acknowledged these results.

## 2020-01-27 IMAGING — MR MR MRA HEAD W/O CM
1 series · 16 of 48 positions shown · IV contrast (gadavist)
Comparison: Prior head CT from earlier the same day.

CLINICAL DATA: Initial evaluation for acute TIA, dizziness.

EXAM:
MRI HEAD WITHOUT CONTRAST
MRA HEAD WITHOUT CONTRAST
MRA NECK WITHOUT AND WITH CONTRAST
TECHNIQUE: Multiplanar, multiecho pulse sequences of the brain and surrounding
structures were obtained without intravenous contrast. Angiographic
images of the Circle of Willis were obtained using MRA technique
without intravenous contrast. Angiographic images of the neck were
obtained using MRA technique without and with intravenous contrast.
Carotid stenosis measurements (when applicable) are obtained
utilizing NASCET criteria, using the distal internal carotid
diameter as the denominator.
CONTRAST:  8mL GADAVIST GADOBUTROL 1 MMOL/ML IV SOLN

[Series 5: 3d cow · axial · 0.5mm · 0.41mm/px · z∈[-67,+11]mm · 16 of 172 slices shown]
[im 1/172]
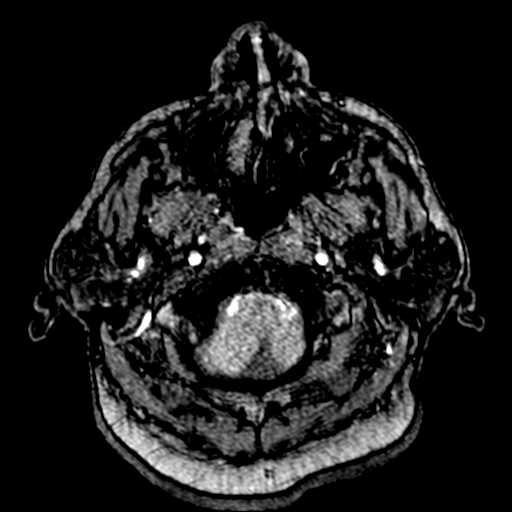
[im 4/172]
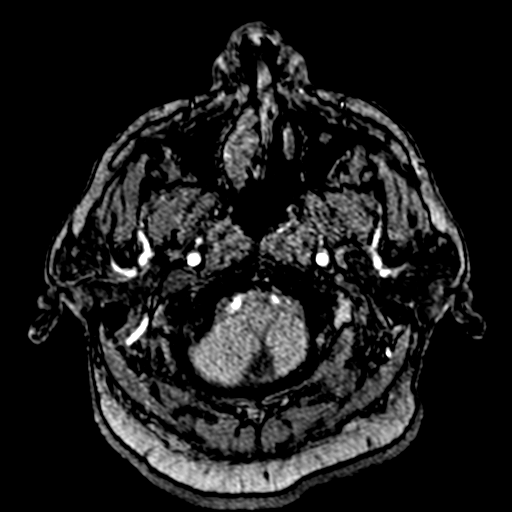
[im 8/172]
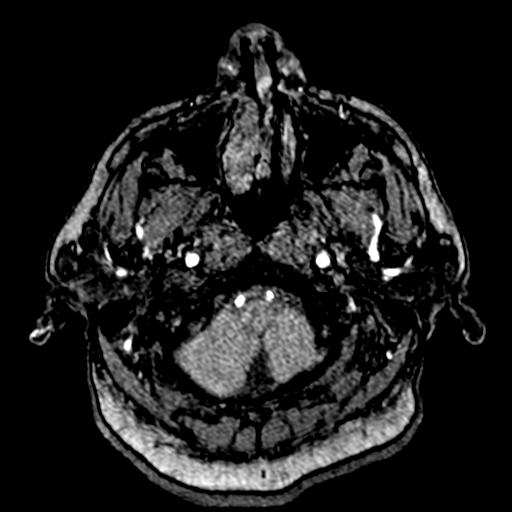
[im 11/172]
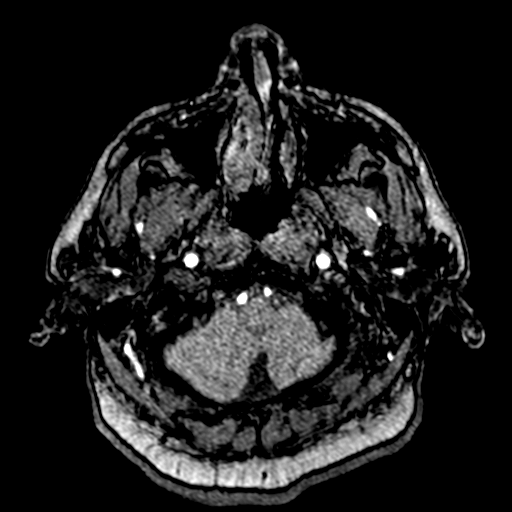
[im 15/172]
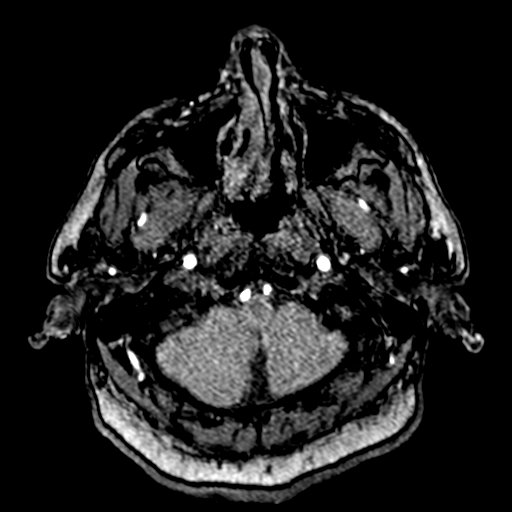
[im 19/172]
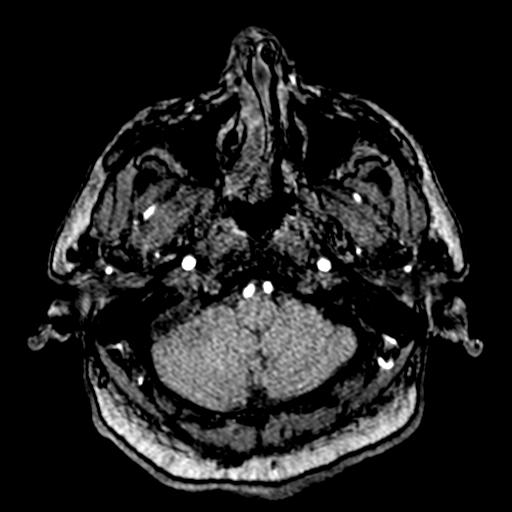
[im 30/172]
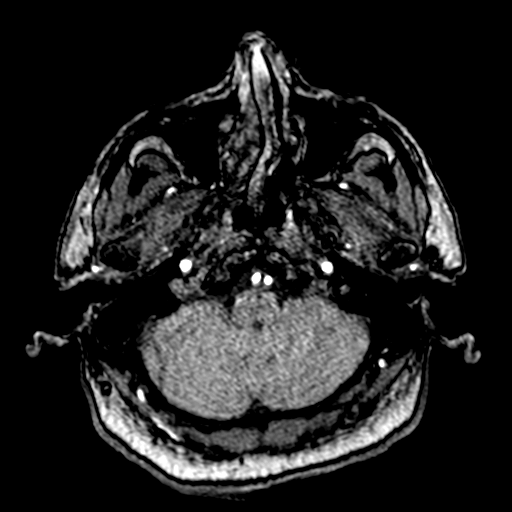
[im 33/172]
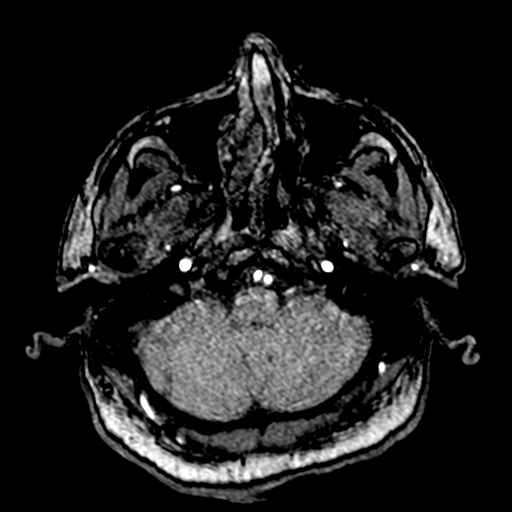
[im 55/172]
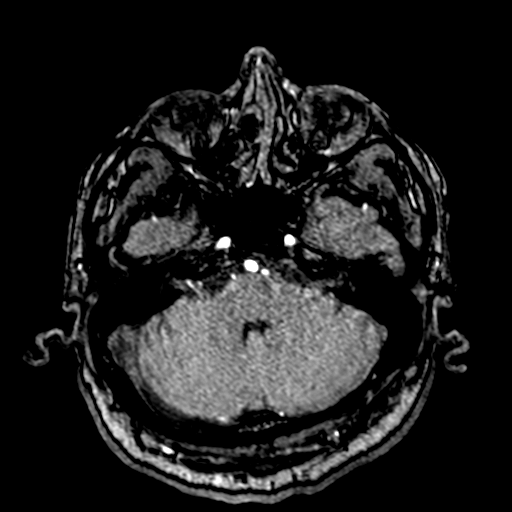
[im 77/172]
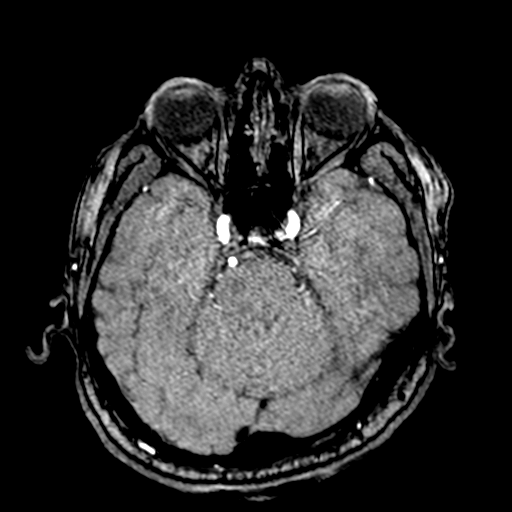
[im 88/172]
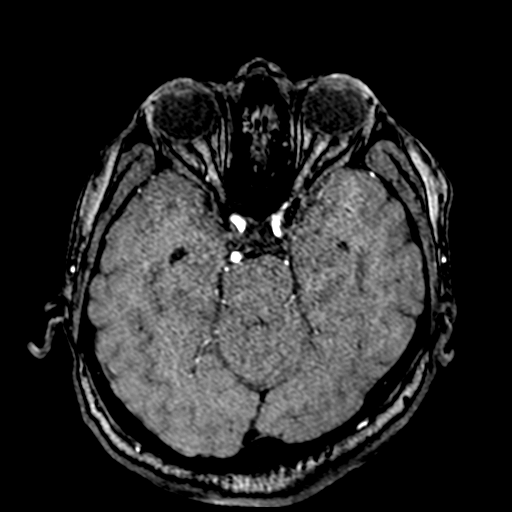
[im 99/172]
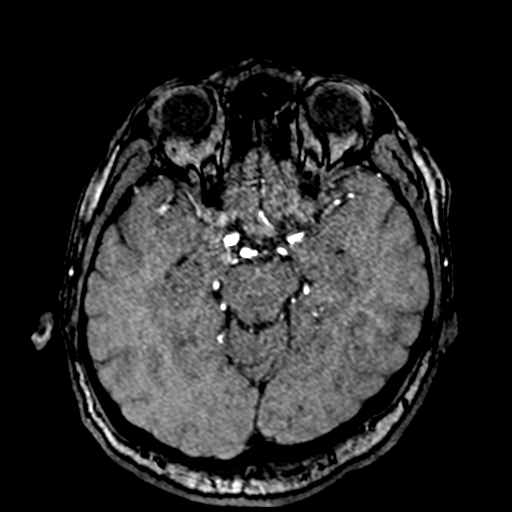
[im 121/172]
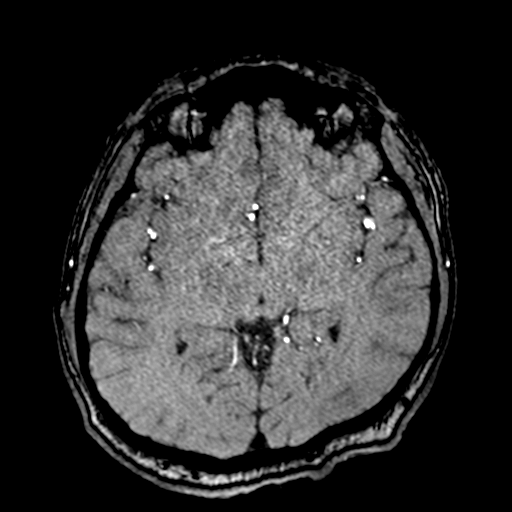
[im 142/172]
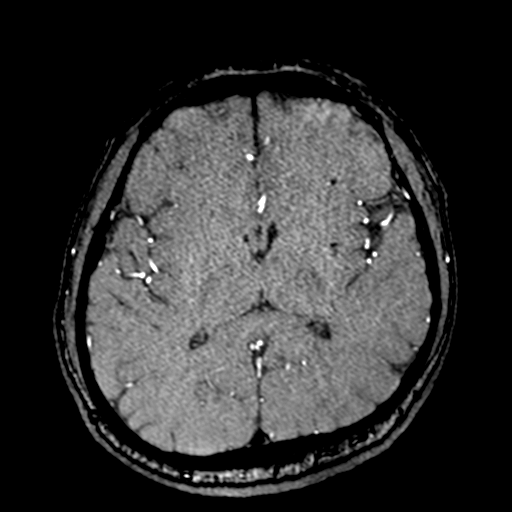
[im 146/172]
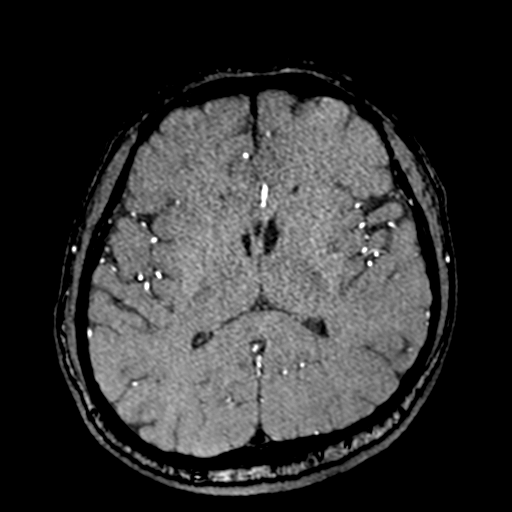
[im 164/172]
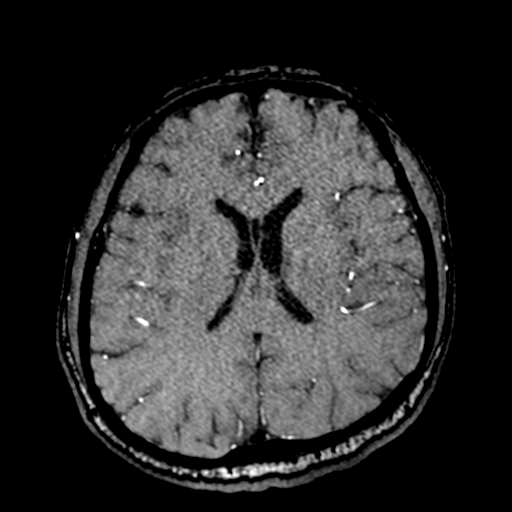

[16 of 48 positions shown; findings below may reference images not displayed]

FINDINGS: MRI HEAD FINDINGS

Brain: Cerebral volume within normal limits for age. Fairly
extensive patchy multifocal foci of T2/FLAIR hyperintensity seen
involving the periventricular, deep, and subcortical white matter of
both cerebral hemispheres, moderately advanced for age. Few of these
foci are oriented perpendicular to the lateral ventricles with
scattered callososeptal involvement. Prominent subcortical
involvement noted at the right greater than left parietal regions.
Minimal patchy infratentorial involvement noted at the left middle
cerebellar peduncle. Findings are nonspecific. No associated
diffusion abnormality to suggest active demyelination.

No evidence for acute or subacute infarct. Gray-white matter
differentiation maintained. No encephalomalacia to suggest chronic
cortical infarction. No evidence for acute or chronic intracranial
hemorrhage. Focus of susceptibility artifact noted at the left
lentiform nucleus related to the previously identified
calcifications seen on prior CT.

No mass lesion, midline shift or mass effect. Ventricles normal size
without hydrocephalus. No extra-axial fluid collection. Pituitary
gland suprasellar region within normal limits. Midline structures
intact.

Vascular: Major intracranial vascular flow voids are well
maintained.

Skull and upper cervical spine: Craniocervical junction within
normal limits. Bone marrow signal intensity normal. Flattening of
the left parieto-occipital calvarium, consistent with plagiocephaly.
No acute scalp soft tissue abnormality.

Sinuses/Orbits: Globes and orbital soft tissues within normal
limits. Scattered mucosal thickening noted within the ethmoidal air
cells and maxillary sinuses. Right-to-left nasal septal deviation
with associated concha bullosa. No significant mastoid effusion.
Inner ear structures grossly normal.

Other: None.

MRA HEAD FINDINGS

ANTERIOR CIRCULATION:

Visualized distal cervical segments of the internal carotid arteries
are widely patent with symmetric antegrade flow. Petrous, cavernous,
and supraclinoid ICAs widely patent without stenosis or other
abnormality. A1 segments widely patent. Normal anterior
communicating artery complex. Anterior cerebral arteries widely
patent to their distal aspects without stenosis. No M1 stenosis or
occlusion. Normal MCA bifurcations. Distal MCA branches well
perfused and symmetric.

POSTERIOR CIRCULATION:

Right vertebral artery dominant and widely patent to the
vertebrobasilar junction. Mild to moderate diffuse narrowing of the
mid left V4 segment, better seen on corresponding MRA of the neck
(series [SX], image 11). Left vertebral artery otherwise patent.
Neither PICA is visualized. Basilar widely patent to its distal
aspect without stenosis. Superior cerebral arteries patent
bilaterally. Both PCAs primarily supplied via the basilar well
perfused to their distal aspects. No intracranial aneurysm or other
vascular abnormality.

MRA NECK FINDINGS

AORTIC ARCH: Visualized aortic arch of normal caliber with normal 3
vessel morphology. No hemodynamically significant stenosis seen
about the origin of the great vessels.

RIGHT CAROTID SYSTEM: Right common and internal carotid arteries
widely patent without stenosis, evidence for dissection, or
occlusion. No significant atheromatous narrowing or irregularity
about the right bifurcation.

LEFT CAROTID SYSTEM: Left common and internal carotid arteries
widely patent without stenosis, evidence for dissection, or
occlusion. No significant atheromatous narrowing or irregularity
about the left bifurcation.

VERTEBRAL ARTERIES: Both vertebral arteries arise from the
subclavian arteries. No proximal subclavian artery stenosis. Right
vertebral artery slightly dominant. Origin of the left vertebral
artery not well seen. Visualized vertebral arteries widely patent
within the neck without stenosis, dissection or occlusion.
IMPRESSION: MRI HEAD IMPRESSION:

1. No acute intracranial abnormality.
2. Extensive T2/FLAIR hyperintensities involving the cerebral white
matter as above, moderately advanced for age. Differential
considerations are broad, and include sequelae of
accelerated/hereditary chronic small-vessel ischemia, changes
related to prior trauma, hypercoagulable state, vasculitis, sequelae
of complicated migraines, prior infectious or inflammatory process,
or possibly demyelination. No associated diffusion abnormality to
suggest active demyelination.
3. Otherwise normal brain MRI for age.

MRA HEAD IMPRESSION:

1. Moderate long segment stenosis of the mid left V4 segment, better
seen on corresponding MRA neck portion of this exam. The right
vertebral artery is dominant, with the remainder of the
vertebrobasilar system widely patent.
2. Otherwise normal intracranial MRA. No large vessel occlusion or
other hemodynamically significant stenosis. No aneurysm.

MRA NECK IMPRESSION:

Normal MRA of the neck with wide patency of both carotid artery
systems and vertebral arteries. No hemodynamically significant
stenosis or other acute vascular abnormality.

## 2020-01-27 IMAGING — MR MR HEAD W/O CM
13 of 16 series · 30 of 48 positions shown · IV contrast (gadavist)
Comparison: Prior head CT from earlier the same day.

CLINICAL DATA: Initial evaluation for acute TIA, dizziness.

EXAM:
MRI HEAD WITHOUT CONTRAST
MRA HEAD WITHOUT CONTRAST
MRA NECK WITHOUT AND WITH CONTRAST
TECHNIQUE: Multiplanar, multiecho pulse sequences of the brain and surrounding
structures were obtained without intravenous contrast. Angiographic
images of the Circle of Willis were obtained using MRA technique
without intravenous contrast. Angiographic images of the neck were
obtained using MRA technique without and with intravenous contrast.
Carotid stenosis measurements (when applicable) are obtained
utilizing NASCET criteria, using the distal internal carotid
diameter as the denominator.
CONTRAST:  8mL GADAVIST GADOBUTROL 1 MMOL/ML IV SOLN

[Series 5: DWI · axial · 3.0mm · 0.88mm/px · z∈[-62,+81]mm · 5 of 104 slices shown (1 of 4)]
[im 1/104]
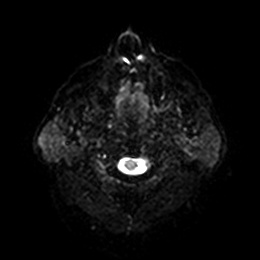
[im 26/104]
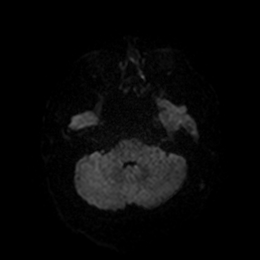
[im 52/104]
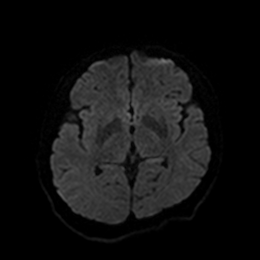
[im 78/104]
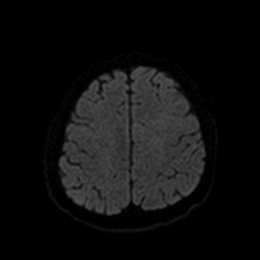
[im 104/104]
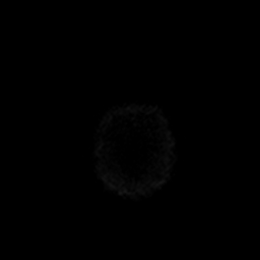

[Series 6: DWI · axial · 3.0mm · 0.88mm/px · z∈[-62,+81]mm · 3 of 52 slices shown (2 of 4)]
[im 1/52]
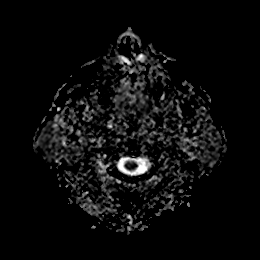
[im 26/52]
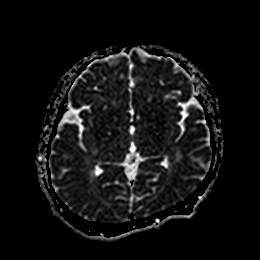
[im 52/52]
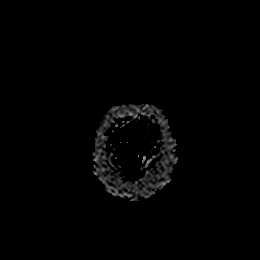

[Series 7: DWI · coronal · 4.0mm · 0.88mm/px · 3 of 64 slices shown (3 of 4)]
[im 1/64]
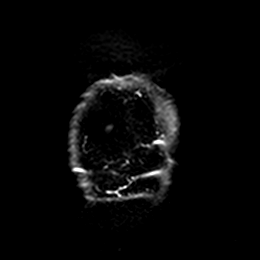
[im 32/64]
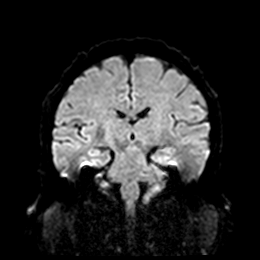
[im 64/64]
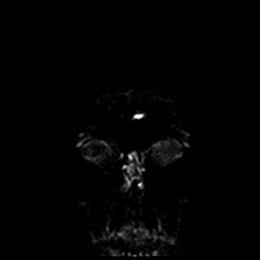

[Series 8: DWI · coronal · 4.0mm · 0.88mm/px · 1 of 32 slices shown (4 of 4)]
[im 1/32]
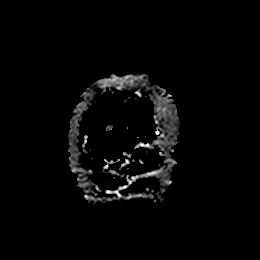

[Series 9: T1 · sagittal · 5.0mm · 0.75mm/px · 1 of 26 slices shown]
[im 1/26]
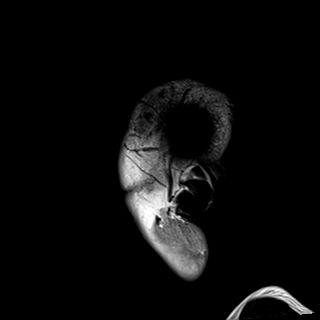

[Series 10: T2 · axial · 5.0mm · 0.72mm/px · 1 of 26 slices shown (1 of 2)]
[im 1/26]
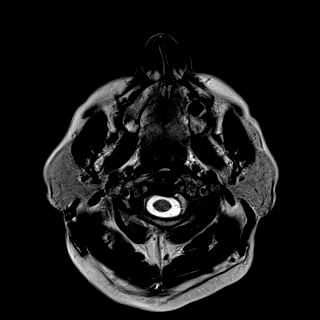

[Series 11: FLAIR · axial · 5.0mm · 0.45mm/px · 1 of 26 slices shown]
[im 1/26]
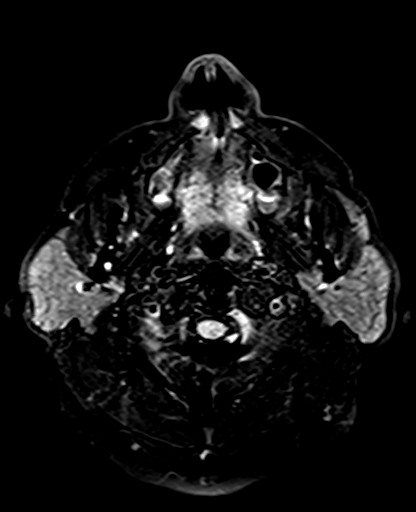

[Series 12: mag_images · axial · 3.0mm · 0.90mm/px · z∈[-63,+80]mm · 2 of 52 slices shown]
[im 1/52]
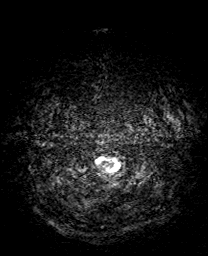
[im 52/52]
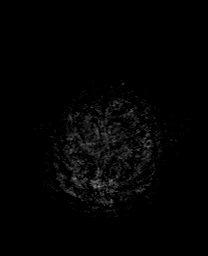

[Series 13: pha_images · axial · 3.0mm · 0.90mm/px · z∈[-60,+72]mm · 2 of 48 slices shown]
[im 1/48]
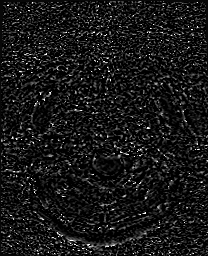
[im 48/48]
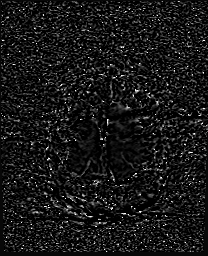

[Series 14: swi_images · axial · 3.0mm · 0.90mm/px · z∈[-63,+80]mm · 2 of 52 slices shown]
[im 1/52]
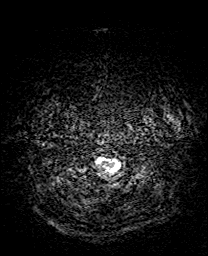
[im 52/52]
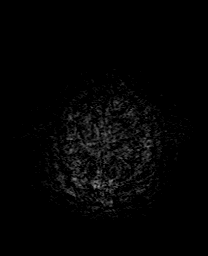

[Series 15: mip_images(sw) · axial · 24.0mm · 0.90mm/px · z∈[-53,+71]mm · 2 of 45 slices shown]
[im 1/45]
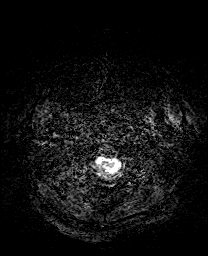
[im 45/45]
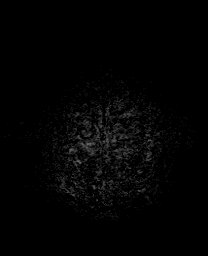

[Series 17: T2 · coronal · 5.0mm · 0.34mm/px · 1 of 29 slices shown (2 of 2)]
[im 1/29]
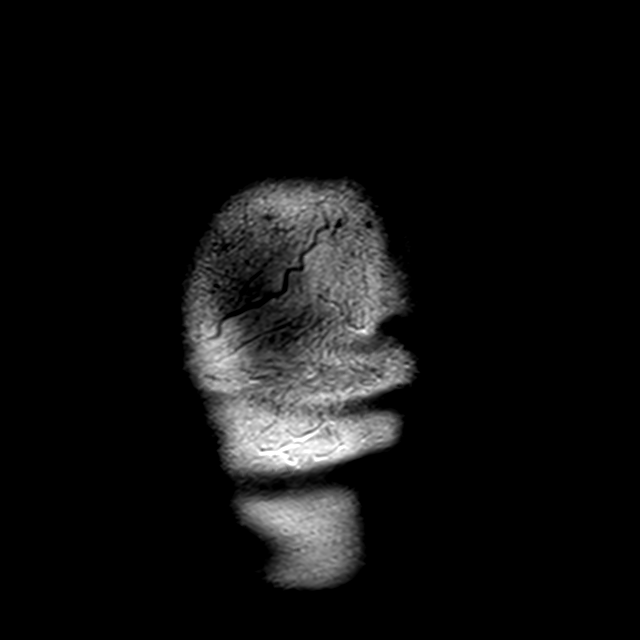

[Series 18: t2_space_dark-fluid_sag_p2_ns-ir · sagittal · 1.0mm · 0.49mm/px · 6 of 192 slices shown]
[im 1/192]
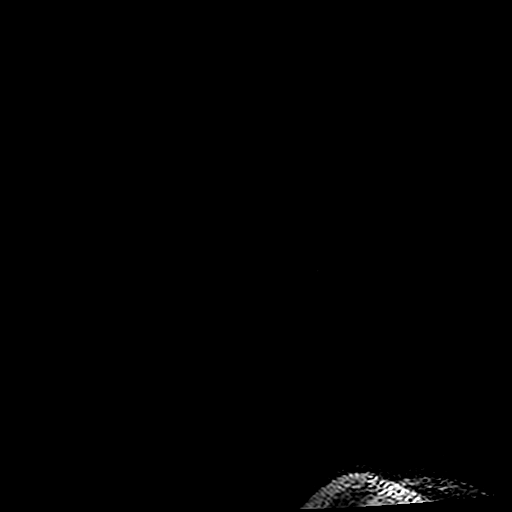
[im 28/192]
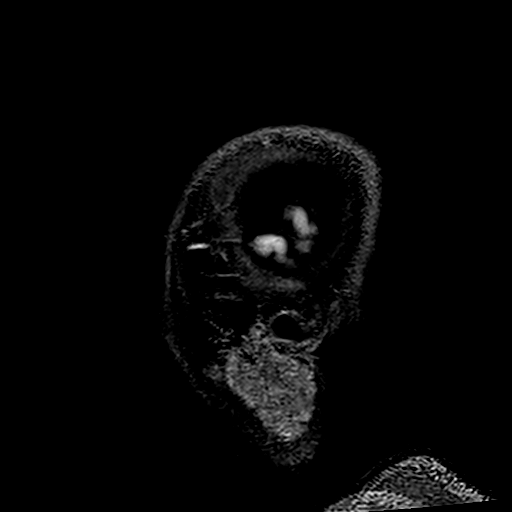
[im 55/192]
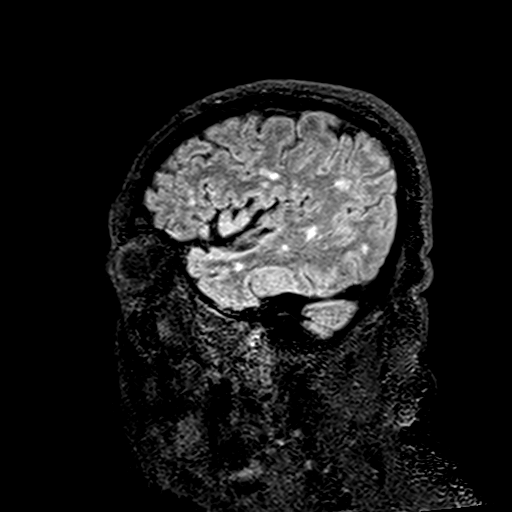
[im 82/192]
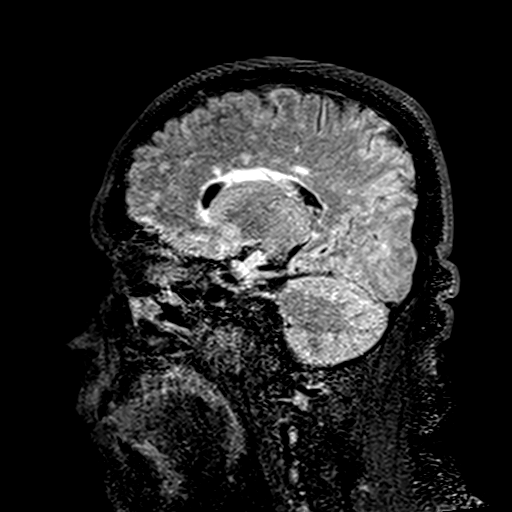
[im 110/192]
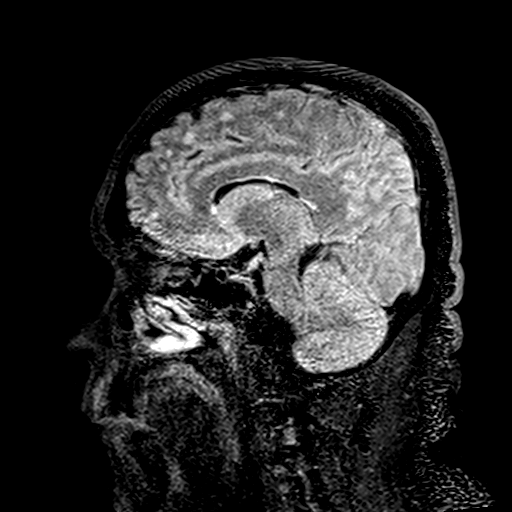
[im 137/192]
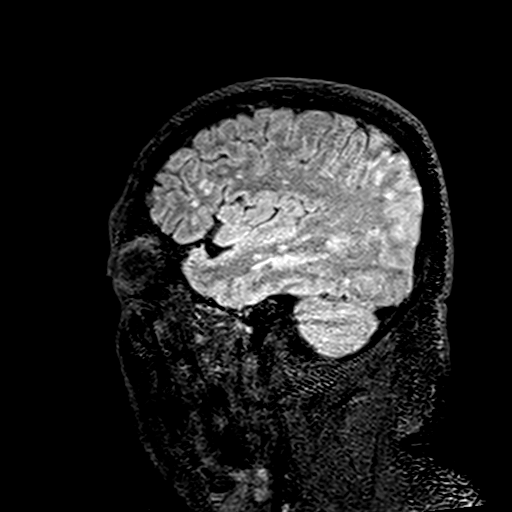

[30 of 48 positions shown; findings below may reference images not displayed]

FINDINGS: MRI HEAD FINDINGS

Brain: Cerebral volume within normal limits for age. Fairly
extensive patchy multifocal foci of T2/FLAIR hyperintensity seen
involving the periventricular, deep, and subcortical white matter of
both cerebral hemispheres, moderately advanced for age. Few of these
foci are oriented perpendicular to the lateral ventricles with
scattered callososeptal involvement. Prominent subcortical
involvement noted at the right greater than left parietal regions.
Minimal patchy infratentorial involvement noted at the left middle
cerebellar peduncle. Findings are nonspecific. No associated
diffusion abnormality to suggest active demyelination.

No evidence for acute or subacute infarct. Gray-white matter
differentiation maintained. No encephalomalacia to suggest chronic
cortical infarction. No evidence for acute or chronic intracranial
hemorrhage. Focus of susceptibility artifact noted at the left
lentiform nucleus related to the previously identified
calcifications seen on prior CT.

No mass lesion, midline shift or mass effect. Ventricles normal size
without hydrocephalus. No extra-axial fluid collection. Pituitary
gland suprasellar region within normal limits. Midline structures
intact.

Vascular: Major intracranial vascular flow voids are well
maintained.

Skull and upper cervical spine: Craniocervical junction within
normal limits. Bone marrow signal intensity normal. Flattening of
the left parieto-occipital calvarium, consistent with plagiocephaly.
No acute scalp soft tissue abnormality.

Sinuses/Orbits: Globes and orbital soft tissues within normal
limits. Scattered mucosal thickening noted within the ethmoidal air
cells and maxillary sinuses. Right-to-left nasal septal deviation
with associated concha bullosa. No significant mastoid effusion.
Inner ear structures grossly normal.

Other: None.

MRA HEAD FINDINGS

ANTERIOR CIRCULATION:

Visualized distal cervical segments of the internal carotid arteries
are widely patent with symmetric antegrade flow. Petrous, cavernous,
and supraclinoid ICAs widely patent without stenosis or other
abnormality. A1 segments widely patent. Normal anterior
communicating artery complex. Anterior cerebral arteries widely
patent to their distal aspects without stenosis. No M1 stenosis or
occlusion. Normal MCA bifurcations. Distal MCA branches well
perfused and symmetric.

POSTERIOR CIRCULATION:

Right vertebral artery dominant and widely patent to the
vertebrobasilar junction. Mild to moderate diffuse narrowing of the
mid left V4 segment, better seen on corresponding MRA of the neck
(series [SX], image 11). Left vertebral artery otherwise patent.
Neither PICA is visualized. Basilar widely patent to its distal
aspect without stenosis. Superior cerebral arteries patent
bilaterally. Both PCAs primarily supplied via the basilar well
perfused to their distal aspects. No intracranial aneurysm or other
vascular abnormality.

MRA NECK FINDINGS

AORTIC ARCH: Visualized aortic arch of normal caliber with normal 3
vessel morphology. No hemodynamically significant stenosis seen
about the origin of the great vessels.

RIGHT CAROTID SYSTEM: Right common and internal carotid arteries
widely patent without stenosis, evidence for dissection, or
occlusion. No significant atheromatous narrowing or irregularity
about the right bifurcation.

LEFT CAROTID SYSTEM: Left common and internal carotid arteries
widely patent without stenosis, evidence for dissection, or
occlusion. No significant atheromatous narrowing or irregularity
about the left bifurcation.

VERTEBRAL ARTERIES: Both vertebral arteries arise from the
subclavian arteries. No proximal subclavian artery stenosis. Right
vertebral artery slightly dominant. Origin of the left vertebral
artery not well seen. Visualized vertebral arteries widely patent
within the neck without stenosis, dissection or occlusion.
IMPRESSION: MRI HEAD IMPRESSION:

1. No acute intracranial abnormality.
2. Extensive T2/FLAIR hyperintensities involving the cerebral white
matter as above, moderately advanced for age. Differential
considerations are broad, and include sequelae of
accelerated/hereditary chronic small-vessel ischemia, changes
related to prior trauma, hypercoagulable state, vasculitis, sequelae
of complicated migraines, prior infectious or inflammatory process,
or possibly demyelination. No associated diffusion abnormality to
suggest active demyelination.
3. Otherwise normal brain MRI for age.

MRA HEAD IMPRESSION:

1. Moderate long segment stenosis of the mid left V4 segment, better
seen on corresponding MRA neck portion of this exam. The right
vertebral artery is dominant, with the remainder of the
vertebrobasilar system widely patent.
2. Otherwise normal intracranial MRA. No large vessel occlusion or
other hemodynamically significant stenosis. No aneurysm.

MRA NECK IMPRESSION:

Normal MRA of the neck with wide patency of both carotid artery
systems and vertebral arteries. No hemodynamically significant
stenosis or other acute vascular abnormality.

## 2020-01-27 IMAGING — MR MR MRA NECK WO/W CM
6 of 8 series · 36 of 48 positions shown · IV contrast (gadavist)
Comparison: Prior head CT from earlier the same day.

CLINICAL DATA: Initial evaluation for acute TIA, dizziness.

EXAM:
MRI HEAD WITHOUT CONTRAST
MRA HEAD WITHOUT CONTRAST
MRA NECK WITHOUT AND WITH CONTRAST
TECHNIQUE: Multiplanar, multiecho pulse sequences of the brain and surrounding
structures were obtained without intravenous contrast. Angiographic
images of the Circle of Willis were obtained using MRA technique
without intravenous contrast. Angiographic images of the neck were
obtained using MRA technique without and with intravenous contrast.
Carotid stenosis measurements (when applicable) are obtained
utilizing NASCET criteria, using the distal internal carotid
diameter as the denominator.
CONTRAST:  8mL GADAVIST GADOBUTROL 1 MMOL/ML IV SOLN

[Series 12: tof_fl3d_tra_iso · axial · 0.6mm · 0.52mm/px · z∈[-203,-124]mm · 8 of 133 slices shown]
[im 1/133]
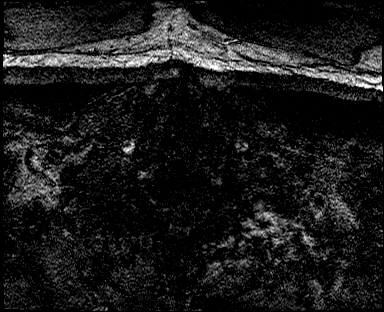
[im 19/133]
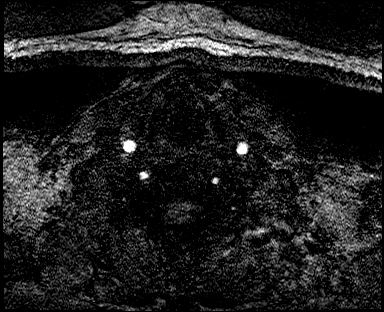
[im 38/133]
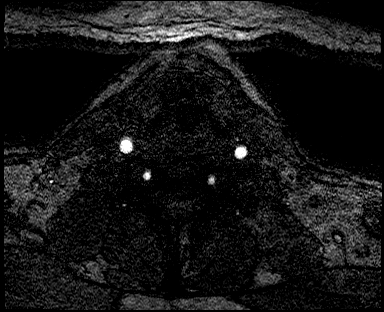
[im 57/133]
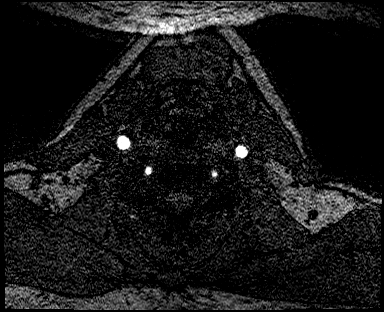
[im 76/133]
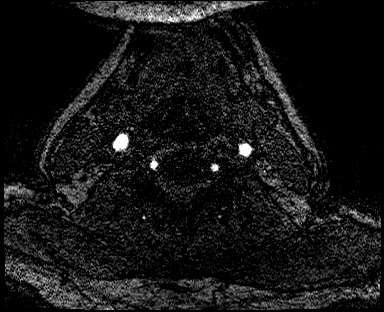
[im 95/133]
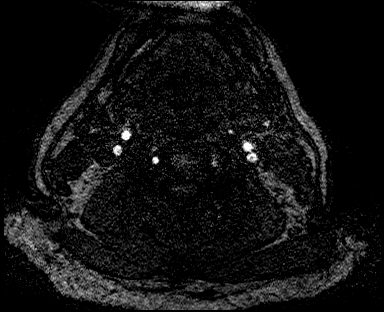
[im 114/133]
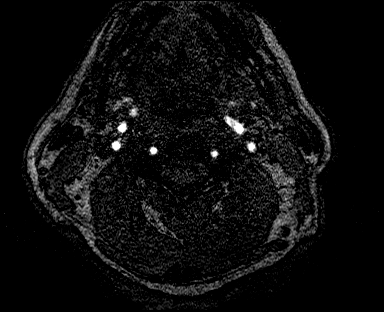
[im 133/133]
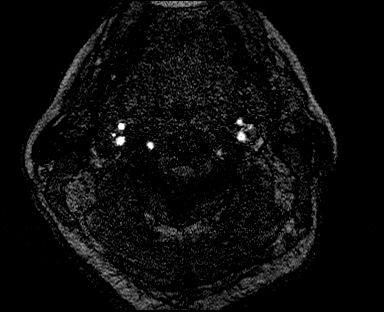

[Series 15: angio_fl3d_cor_pre_ttc=3.0s · coronal · 0.9mm · 0.85mm/px · 6 of 80 slices shown]
[im 1/80]
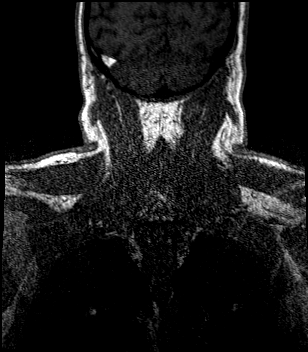
[im 16/80]
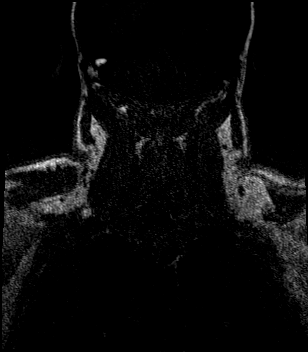
[im 32/80]
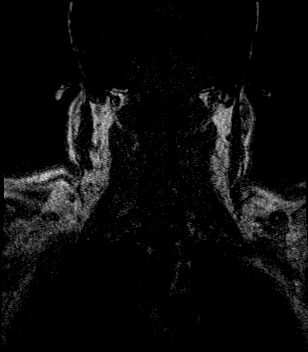
[im 48/80]
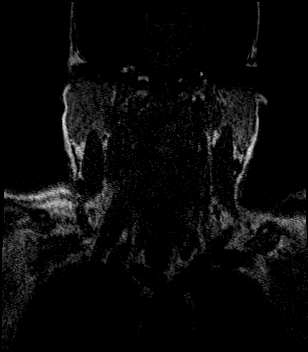
[im 64/80]
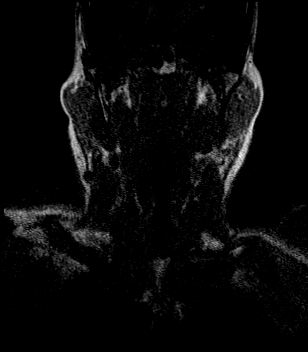
[im 80/80]
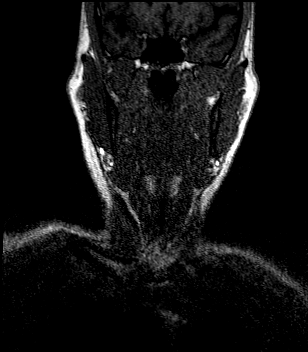

[Series 17: angio_fl3d_cor_post_ttc=3.0s · coronal · 0.9mm · 0.85mm/px · 6 of 79 slices shown (1 of 2)]
[im 1/79]
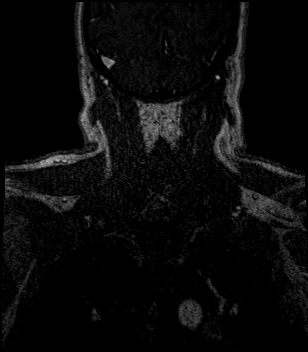
[im 16/79]
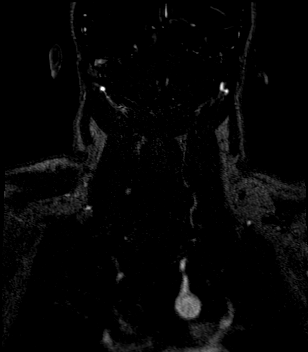
[im 32/79]
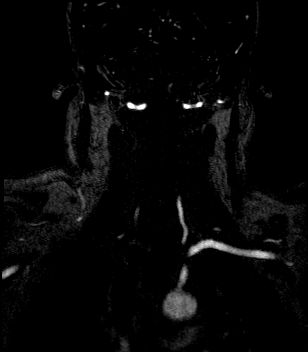
[im 47/79]
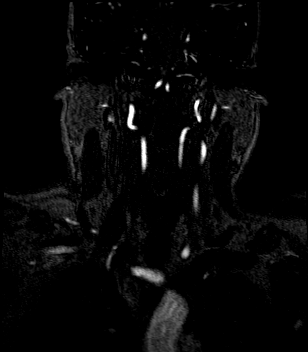
[im 63/79]
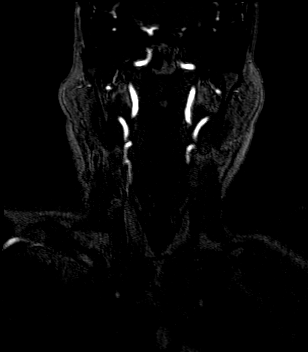
[im 79/79]
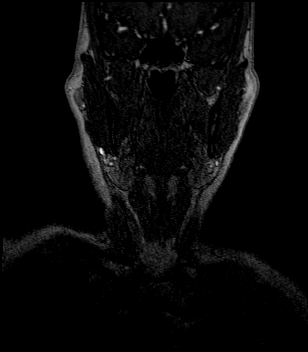

[Series 18: angio_fl3d_cor_post_ttc=3.0s_moco-adv · coronal · 0.9mm · 0.85mm/px · 5 of 78 slices shown]
[im 1/78]
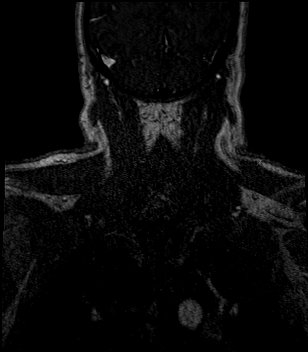
[im 20/78]
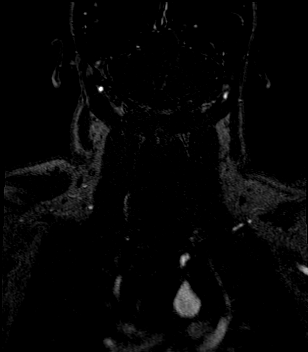
[im 39/78]
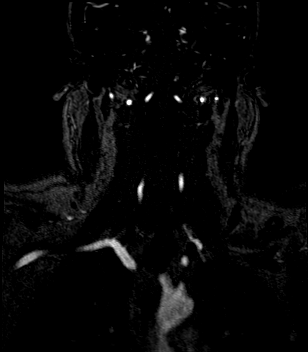
[im 58/78]
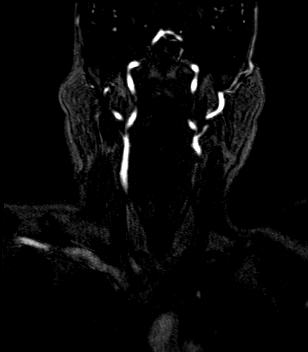
[im 78/78]
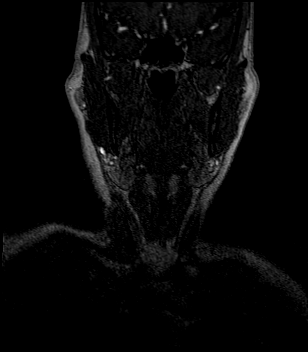

[Series 19: angio_fl3d_cor_post_ttc=3.0s_moco-adv_sub · coronal · 0.9mm · 0.85mm/px · 5 of 77 slices shown]
[im 1/77]
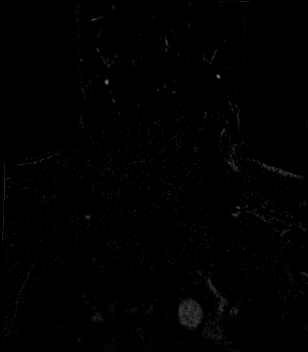
[im 20/77]
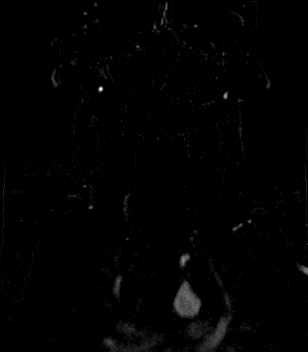
[im 39/77]
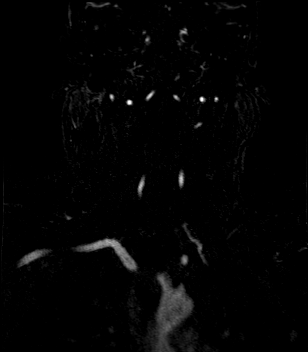
[im 58/77]
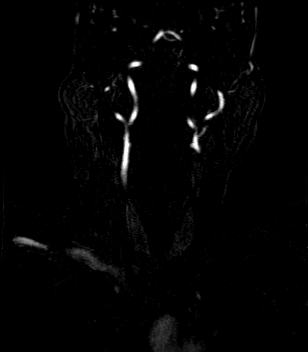
[im 77/77]
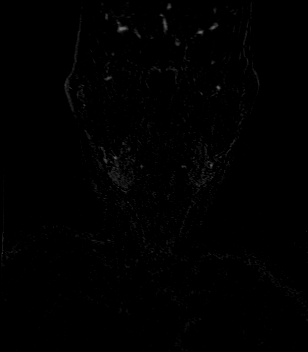

[Series 21: angio_fl3d_cor_post_ttc=3.0s · coronal · 0.9mm · 0.85mm/px · 6 of 80 slices shown (2 of 2)]
[im 1/80]
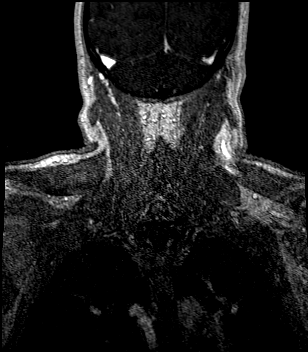
[im 16/80]
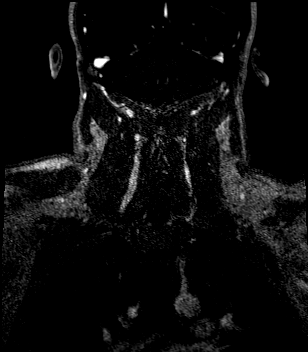
[im 32/80]
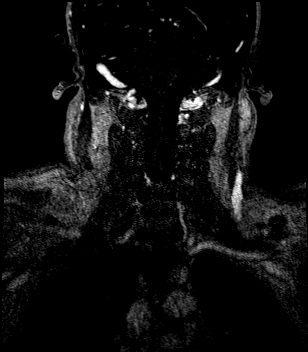
[im 48/80]
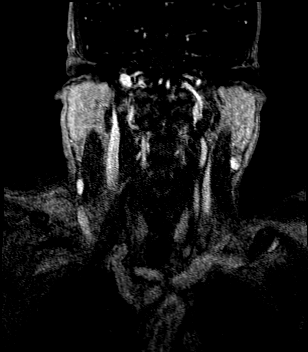
[im 64/80]
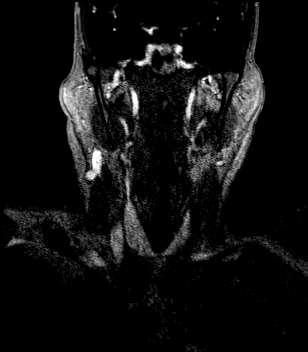
[im 80/80]
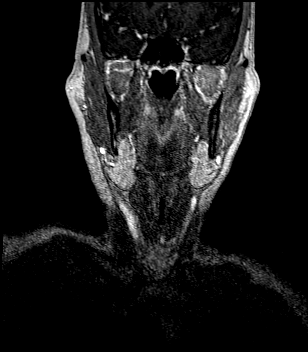

[36 of 48 positions shown; findings below may reference images not displayed]

FINDINGS: MRI HEAD FINDINGS

Brain: Cerebral volume within normal limits for age. Fairly
extensive patchy multifocal foci of T2/FLAIR hyperintensity seen
involving the periventricular, deep, and subcortical white matter of
both cerebral hemispheres, moderately advanced for age. Few of these
foci are oriented perpendicular to the lateral ventricles with
scattered callososeptal involvement. Prominent subcortical
involvement noted at the right greater than left parietal regions.
Minimal patchy infratentorial involvement noted at the left middle
cerebellar peduncle. Findings are nonspecific. No associated
diffusion abnormality to suggest active demyelination.

No evidence for acute or subacute infarct. Gray-white matter
differentiation maintained. No encephalomalacia to suggest chronic
cortical infarction. No evidence for acute or chronic intracranial
hemorrhage. Focus of susceptibility artifact noted at the left
lentiform nucleus related to the previously identified
calcifications seen on prior CT.

No mass lesion, midline shift or mass effect. Ventricles normal size
without hydrocephalus. No extra-axial fluid collection. Pituitary
gland suprasellar region within normal limits. Midline structures
intact.

Vascular: Major intracranial vascular flow voids are well
maintained.

Skull and upper cervical spine: Craniocervical junction within
normal limits. Bone marrow signal intensity normal. Flattening of
the left parieto-occipital calvarium, consistent with plagiocephaly.
No acute scalp soft tissue abnormality.

Sinuses/Orbits: Globes and orbital soft tissues within normal
limits. Scattered mucosal thickening noted within the ethmoidal air
cells and maxillary sinuses. Right-to-left nasal septal deviation
with associated concha bullosa. No significant mastoid effusion.
Inner ear structures grossly normal.

Other: None.

MRA HEAD FINDINGS

ANTERIOR CIRCULATION:

Visualized distal cervical segments of the internal carotid arteries
are widely patent with symmetric antegrade flow. Petrous, cavernous,
and supraclinoid ICAs widely patent without stenosis or other
abnormality. A1 segments widely patent. Normal anterior
communicating artery complex. Anterior cerebral arteries widely
patent to their distal aspects without stenosis. No M1 stenosis or
occlusion. Normal MCA bifurcations. Distal MCA branches well
perfused and symmetric.

POSTERIOR CIRCULATION:

Right vertebral artery dominant and widely patent to the
vertebrobasilar junction. Mild to moderate diffuse narrowing of the
mid left V4 segment, better seen on corresponding MRA of the neck
(series [SX], image 11). Left vertebral artery otherwise patent.
Neither PICA is visualized. Basilar widely patent to its distal
aspect without stenosis. Superior cerebral arteries patent
bilaterally. Both PCAs primarily supplied via the basilar well
perfused to their distal aspects. No intracranial aneurysm or other
vascular abnormality.

MRA NECK FINDINGS

AORTIC ARCH: Visualized aortic arch of normal caliber with normal 3
vessel morphology. No hemodynamically significant stenosis seen
about the origin of the great vessels.

RIGHT CAROTID SYSTEM: Right common and internal carotid arteries
widely patent without stenosis, evidence for dissection, or
occlusion. No significant atheromatous narrowing or irregularity
about the right bifurcation.

LEFT CAROTID SYSTEM: Left common and internal carotid arteries
widely patent without stenosis, evidence for dissection, or
occlusion. No significant atheromatous narrowing or irregularity
about the left bifurcation.

VERTEBRAL ARTERIES: Both vertebral arteries arise from the
subclavian arteries. No proximal subclavian artery stenosis. Right
vertebral artery slightly dominant. Origin of the left vertebral
artery not well seen. Visualized vertebral arteries widely patent
within the neck without stenosis, dissection or occlusion.
IMPRESSION: MRI HEAD IMPRESSION:

1. No acute intracranial abnormality.
2. Extensive T2/FLAIR hyperintensities involving the cerebral white
matter as above, moderately advanced for age. Differential
considerations are broad, and include sequelae of
accelerated/hereditary chronic small-vessel ischemia, changes
related to prior trauma, hypercoagulable state, vasculitis, sequelae
of complicated migraines, prior infectious or inflammatory process,
or possibly demyelination. No associated diffusion abnormality to
suggest active demyelination.
3. Otherwise normal brain MRI for age.

MRA HEAD IMPRESSION:

1. Moderate long segment stenosis of the mid left V4 segment, better
seen on corresponding MRA neck portion of this exam. The right
vertebral artery is dominant, with the remainder of the
vertebrobasilar system widely patent.
2. Otherwise normal intracranial MRA. No large vessel occlusion or
other hemodynamically significant stenosis. No aneurysm.

MRA NECK IMPRESSION:

Normal MRA of the neck with wide patency of both carotid artery
systems and vertebral arteries. No hemodynamically significant
stenosis or other acute vascular abnormality.
